# Patient Record
Sex: Female | Born: 1955 | Race: White | Hispanic: No | Marital: Married | Smoking: Never smoker
Health system: Southern US, Community
[De-identification: ages and names within clinical notes are randomized; demographics above are authoritative.]

## PROBLEM LIST (undated history)

## (undated) DIAGNOSIS — M810 Age-related osteoporosis without current pathological fracture: Secondary | ICD-10-CM

## (undated) DIAGNOSIS — I493 Ventricular premature depolarization: Secondary | ICD-10-CM

## (undated) DIAGNOSIS — Z23 Encounter for immunization: Secondary | ICD-10-CM

## (undated) DIAGNOSIS — N390 Urinary tract infection, site not specified: Secondary | ICD-10-CM

## (undated) HISTORY — PX: BREAST BIOPSY: SHX20

## (undated) HISTORY — DX: Ventricular premature depolarization: I49.3

## (undated) HISTORY — PX: BREAST SURGERY: SHX581

## (undated) HISTORY — PX: WISDOM TOOTH EXTRACTION: SHX21

## (undated) HISTORY — DX: Urinary tract infection, site not specified: N39.0

## (undated) HISTORY — DX: Age-related osteoporosis without current pathological fracture: M81.0

## (undated) HISTORY — DX: Encounter for immunization: Z23

## (undated) HISTORY — PX: APPENDECTOMY: SHX54

## (undated) HISTORY — PX: THUMB ARTHROSCOPY: SHX2509

## (undated) HISTORY — PX: VARICOSE VEIN SURGERY: SHX832

---

## 2011-09-27 DIAGNOSIS — H401190 Primary open-angle glaucoma, unspecified eye, stage unspecified: Secondary | ICD-10-CM | POA: Insufficient documentation

## 2011-09-27 DIAGNOSIS — H47329 Drusen of optic disc, unspecified eye: Secondary | ICD-10-CM | POA: Insufficient documentation

## 2011-09-27 DIAGNOSIS — H53009 Unspecified amblyopia, unspecified eye: Secondary | ICD-10-CM | POA: Insufficient documentation

## 2011-09-27 DIAGNOSIS — H40059 Ocular hypertension, unspecified eye: Secondary | ICD-10-CM | POA: Insufficient documentation

## 2012-10-16 DIAGNOSIS — H40009 Preglaucoma, unspecified, unspecified eye: Secondary | ICD-10-CM | POA: Insufficient documentation

## 2015-10-27 LAB — HM PAP SMEAR: HM Pap smear: NEGATIVE

## 2015-11-18 LAB — HM COLONOSCOPY

## 2016-08-16 LAB — CBC AND DIFFERENTIAL
HEMATOCRIT: 42 (ref 36–46)
HEMOGLOBIN: 13.6 (ref 12.0–16.0)
PLATELETS: 238 (ref 150–399)
WBC: 6.8

## 2016-08-16 LAB — BASIC METABOLIC PANEL
BUN: 14 (ref 4–21)
Creatinine: 0.6 (ref 0.5–1.1)
Glucose: 98
Potassium: 4.8 (ref 3.4–5.3)
Sodium: 141 (ref 137–147)

## 2016-08-16 LAB — LIPID PANEL
CHOLESTEROL: 225 — AB (ref 0–200)
HDL: 69 (ref 35–70)
LDL Cholesterol: 140
TRIGLYCERIDES: 82 (ref 40–160)

## 2016-08-16 LAB — TSH: TSH: 0.8 (ref 0.41–5.90)

## 2017-03-16 ENCOUNTER — Encounter: Payer: Self-pay | Admitting: Family Medicine

## 2017-03-24 LAB — HM MAMMOGRAPHY

## 2017-07-27 ENCOUNTER — Encounter: Payer: Self-pay | Admitting: Family Medicine

## 2017-07-27 ENCOUNTER — Ambulatory Visit (INDEPENDENT_AMBULATORY_CARE_PROVIDER_SITE_OTHER): Payer: 59 | Admitting: Family Medicine

## 2017-07-27 VITALS — BP 122/84 | HR 64 | Temp 98.2°F | Ht 62.5 in | Wt 132.0 lb

## 2017-07-27 DIAGNOSIS — S93409A Sprain of unspecified ligament of unspecified ankle, initial encounter: Secondary | ICD-10-CM | POA: Insufficient documentation

## 2017-07-27 DIAGNOSIS — Z13220 Encounter for screening for lipoid disorders: Secondary | ICD-10-CM

## 2017-07-27 DIAGNOSIS — M25571 Pain in right ankle and joints of right foot: Secondary | ICD-10-CM | POA: Diagnosis not present

## 2017-07-27 DIAGNOSIS — Z0000 Encounter for general adult medical examination without abnormal findings: Secondary | ICD-10-CM | POA: Insufficient documentation

## 2017-07-27 LAB — COMPREHENSIVE METABOLIC PANEL
ALBUMIN: 4.1 g/dL (ref 3.5–5.2)
ALK PHOS: 74 U/L (ref 39–117)
ALT: 13 U/L (ref 0–35)
AST: 18 U/L (ref 0–37)
BUN: 9 mg/dL (ref 6–23)
CALCIUM: 9.4 mg/dL (ref 8.4–10.5)
CO2: 30 mEq/L (ref 19–32)
Chloride: 103 mEq/L (ref 96–112)
Creatinine, Ser: 0.61 mg/dL (ref 0.40–1.20)
GFR: 106.05 mL/min (ref >60.00)
Glucose, Bld: 101 mg/dL — ABNORMAL HIGH (ref 70–99)
POTASSIUM: 4.4 meq/L (ref 3.5–5.1)
Sodium: 140 mEq/L (ref 135–145)
TOTAL PROTEIN: 7 g/dL (ref 6.0–8.3)
Total Bilirubin: 0.4 mg/dL (ref 0.2–1.2)

## 2017-07-27 LAB — CBC
HCT: 39.7 % (ref 36.0–46.0)
HEMOGLOBIN: 13.5 g/dL (ref 12.0–15.0)
MCHC: 34.1 g/dL (ref 30.0–36.0)
MCV: 88.8 fl (ref 78.0–100.0)
Platelets: 223 10*3/uL (ref 150.0–400.0)
RBC: 4.47 Mil/uL (ref 3.87–5.11)
RDW: 12.5 % (ref 11.5–15.5)
WBC: 5.3 10*3/uL (ref 4.0–10.5)

## 2017-07-27 LAB — LIPID PANEL
CHOL/HDL RATIO: 3
Cholesterol: 221 mg/dL — ABNORMAL HIGH (ref 0–200)
HDL: 74.5 mg/dL (ref >39.00)
LDL CALC: 124 mg/dL — AB (ref 0–99)
NonHDL: 146.57
Triglycerides: 114 mg/dL (ref 0.0–149.0)
VLDL: 22.8 mg/dL (ref 0.0–40.0)

## 2017-07-27 LAB — TSH: TSH: 1.3 u[IU]/mL (ref 0.35–4.50)

## 2017-07-27 NOTE — Assessment & Plan Note (Signed)
Discussed that sprains can take a while to heal. She requests to see sports medicine for further evaluation as it is limiting her activity with hiking.

## 2017-07-27 NOTE — Assessment & Plan Note (Signed)
Well adult Orders Placed This Encounter  Procedures  . Comp Met (CMET)  . CBC  . TSH  . Lipid Profile  . Ambulatory referral to Sports Medicine    Referral Priority:   Routine    Referral Type:   Consultation    Number of Visits Requested:   1  Immunizations:  Reports up to date Screenings:  Lipid screening ordered.  Reports up to date on other screenings.  Anticipatory guidance/Risk factor reduction:  Advised to keep up healthy lifestyle habits.  Additional recommendations per AVS.

## 2017-07-27 NOTE — Progress Notes (Signed)
Kimberly Cross - 62 y.o. female MRN [id number]  Date of birth: 09-08-1955  Subjective Chief Complaint  Patient presents with  . Establish Care    CPE with labs , fasting    HPI Kimberly Cross is a 62 y.o. female here today to establish care.  She has been in fairly good health.  Reports rolling her ankle stepping off a sidewalk about 9-10 weeks ago.  Had swelling and bruising throughout entire ankle initially.  Improved but still with some swelling over lateral malleolus.  No pain with walking but very difficult to walk going uphill.  Denies numbness and tingling.    In regards to health maintenance she reports having pap and mammogram prior to moving from [place] this year.  She had a colonoscopy last year.   Review of Systems  Constitutional: Negative for chills, fever, malaise/fatigue and weight loss.  HENT: Negative for congestion, ear pain and sore throat.   Eyes: Negative for blurred vision, double vision and pain.  Respiratory: Negative for cough and shortness of breath.   Cardiovascular: Negative for chest pain and palpitations.  Gastrointestinal: Negative for abdominal pain, blood in stool, constipation, heartburn and nausea.  Genitourinary: Negative for dysuria and urgency.  Musculoskeletal: Positive for joint pain (Right ankle). Negative for myalgias.  Neurological: Negative for dizziness and headaches.  Endo/Heme/Allergies: Does not bruise/bleed easily.  Psychiatric/Behavioral: Negative for depression. The patient is not nervous/anxious and does not have insomnia.      No Known Allergies  Past Medical History:  Diagnosis Date  . Immunization, BCG   . UTI (urinary tract infection)     Past Surgical History:  Procedure Laterality Date  . APPENDECTOMY    . BREAST SURGERY    . THUMB ARTHROSCOPY Bilateral     Social History   Socioeconomic History  . Marital status: Married    Spouse name: Not on file  . Number of children: Not on file  . Years of education: Not on file  .  Highest education level: Not on file  Occupational History  . Not on file  Social Needs  . Financial resource strain: Not on file  . Food insecurity:    Worry: Not on file    Inability: Not on file  . Transportation needs:    Medical: Not on file    Non-medical: Not on file  Tobacco Use  . Smoking status: Never Smoker  . Smokeless tobacco: Never Used  Substance and Sexual Activity  . Alcohol use: Never    Frequency: Never  . Drug use: Never  . Sexual activity: Yes  Lifestyle  . Physical activity:    Days per week: Not on file    Minutes per session: Not on file  . Stress: Not on file  Relationships  . Social connections:    Talks on phone: Not on file    Gets together: Not on file    Attends religious service: Not on file    Active member of club or organization: Not on file    Attends meetings of clubs or organizations: Not on file    Relationship status: Not on file  Other Topics Concern  . Not on file  Social History Narrative  . Not on file    Family History  Problem Relation Age of Onset  . Hyperlipidemia Mother   . Diabetes Father   . Heart disease Father   . Diabetes Sister   . Early death Maternal Grandmother   . Heart disease Maternal Grandfather   .  Stroke Paternal Grandmother   . Cancer Paternal Grandfather   . Diabetes Brother   . Cancer Brother     Health Maintenance  Topic Date Due  . Hepatitis C Screening  10/05/1955  . HIV Screening  10/09/1971  . TETANUS/TDAP  10/09/1975  . INFLUENZA VACCINE  08/03/2017  . MAMMOGRAM  01/10/2019  . PAP SMEAR  01/10/2020  . COLONOSCOPY  01/10/2026    ----------------------------------------------------------------------------------------------------------------------------------------------------------------------------------------------------------------- Physical Exam BP 122/84 (BP Location: Left Arm, Patient Position: Sitting, Cuff Size: Normal)   Pulse 64   Temp 98.2 F (36.8 C) (Oral)   Ht 5'  2.5" (1.588 m)   Wt 132 lb (59.9 kg)   SpO2 100%   BMI 23.76 kg/m   Physical Exam  Constitutional: She is oriented to person, place, and time. She appears well-nourished. No distress.  HENT:  Head: Normocephalic and atraumatic.  Right Ear: External ear normal.  Left Ear: External ear normal.  Mouth/Throat: Oropharynx is clear and moist.  Eyes: No scleral icterus.  Neck: Neck supple. No thyromegaly present.  Cardiovascular: Normal rate, regular rhythm, normal heart sounds and intact distal pulses.  Pulmonary/Chest: Effort normal and breath sounds normal.  Musculoskeletal:  Right ankle with small effusion overlying lateral malleolus.  No ttp along bony structures.  ROM is fairly good.  Some difficulty with dorsiflexion.  Pain with drawer testing posterior to malleolus.     Lymphadenopathy:    She has no cervical adenopathy.  Neurological: She is alert and oriented to person, place, and time. No cranial nerve deficit.  Skin: Skin is warm and dry. No rash noted.  Psychiatric: She has a normal mood and affect. Her behavior is normal.    ------------------------------------------------------------------------------------------------------------------------------------------------------------------------------------------------------------------- Assessment and Plan  Acute right ankle pain Discussed that sprains can take a while to heal. She requests to see sports medicine for further evaluation as it is limiting her activity with hiking.    Well adult exam Well adult Orders Placed This Encounter  Procedures  . Comp Met (CMET)  . CBC  . TSH  . Lipid Profile  . Ambulatory referral to Sports Medicine    Referral Priority:   Routine    Referral Type:   Consultation    Number of Visits Requested:   1  Immunizations:  Reports up to date Screenings:  Lipid screening ordered.  Reports up to date on other screenings.  Anticipatory guidance/Risk factor reduction:  Advised to keep up  healthy lifestyle habits.  Additional recommendations per AVS.      

## 2017-07-27 NOTE — Patient Instructions (Signed)

## 2017-07-31 ENCOUNTER — Ambulatory Visit (INDEPENDENT_AMBULATORY_CARE_PROVIDER_SITE_OTHER): Payer: 59

## 2017-07-31 ENCOUNTER — Ambulatory Visit: Payer: 59 | Admitting: Family Medicine

## 2017-07-31 ENCOUNTER — Encounter: Payer: Self-pay | Admitting: Family Medicine

## 2017-07-31 VITALS — BP 122/62 | HR 72 | Ht 62.5 in | Wt 132.0 lb

## 2017-07-31 DIAGNOSIS — S93492A Sprain of other ligament of left ankle, initial encounter: Secondary | ICD-10-CM | POA: Diagnosis not present

## 2017-07-31 DIAGNOSIS — M25571 Pain in right ankle and joints of right foot: Secondary | ICD-10-CM

## 2017-07-31 NOTE — Patient Instructions (Signed)
Nice to meet you  Please try the exercises on a regular basis  Please try ice and compression  Please see me back in 3 weeks

## 2017-07-31 NOTE — Progress Notes (Signed)
Kimberly Cross - 62 y.o. female MRN [id number]  Date of birth: 1955/03/18  SUBJECTIVE:  Including CC & ROS.  Chief Complaint  Patient presents with  . 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 She reports she fell off her sidewalk and twisted her ankle 10 weeks ago. Had swelling and bruising throughout entire ankle initially. Swelling has improved over her lateral malleolus.  Denies pain walking. She enjoys hiking but is unable to due to the pain walking uphill. Severe to mild when walking uphill. She has been applying ice and elevating her ankle. Pain is occurring over the distal fibula and the lateral ankle joint. No pain with walking on flat. Has turned her ankle in the past but hasn't lasted this long before.      Review of Systems  Constitutional: Negative for fever.  HENT: Negative for congestion.   Respiratory: Negative for cough.   Cardiovascular: Negative for chest pain.  Gastrointestinal: Negative for abdominal pain.  Musculoskeletal: Positive for gait problem.  Skin: Negative for color change.  Neurological: Negative for weakness.  Hematological: Negative for adenopathy.  Psychiatric/Behavioral: Negative for agitation.    HISTORY: Past Medical, Surgical, Social, and Family History Reviewed & Updated per EMR.   Pertinent Historical Findings include:  Past Medical History:  Diagnosis Date  . Immunization, BCG   . UTI (urinary tract infection)     Past Surgical History:  Procedure Laterality Date  . APPENDECTOMY    . BREAST SURGERY    . THUMB ARTHROSCOPY Bilateral     No Known Allergies  Family History  Problem Relation Age of Onset  . Hyperlipidemia Mother   . Diabetes Father   . Heart disease Father   . Diabetes Sister   . Early death Maternal Grandmother   . Heart disease Maternal Grandfather   . Stroke Paternal Grandmother   . Cancer Paternal Grandfather   . Diabetes Brother   . Cancer Brother      Social  History   Socioeconomic History  . Marital status: Married    Spouse name: Not on file  . Number of children: Not on file  . Years of education: Not on file  . Highest education level: Not on file  Occupational History  . Not on file  Social Needs  . Financial resource strain: Not on file  . Food insecurity:    Worry: Not on file    Inability: Not on file  . Transportation needs:    Medical: Not on file    Non-medical: Not on file  Tobacco Use  . Smoking status: Never Smoker  . Smokeless tobacco: Never Used  Substance and Sexual Activity  . Alcohol use: Never    Frequency: Never  . Drug use: Never  . Sexual activity: Yes  Lifestyle  . Physical activity:    Days per week: Not on file    Minutes per session: Not on file  . Stress: Not on file  Relationships  . Social connections:    Talks on phone: Not on file    Gets together: Not on file    Attends religious service: Not on file    Active member of club or organization: Not on file    Attends meetings of clubs or organizations: Not on file    Relationship status: Not on file  . Intimate partner violence:    Fear of current or ex partner: Not on file  Emotionally abused: Not on file    Physically abused: Not on file    Forced sexual activity: Not on file  Other Topics Concern  . Not on file  Social History Narrative  . Not on file     PHYSICAL EXAM:  VS: BP 122/62 (BP Location: Right Arm, Patient Position: Sitting, Cuff Size: Normal)   Pulse 72   Ht 5' 2.5" (1.588 m)   Wt 132 lb (59.9 kg)   SpO2 97%   BMI 23.76 kg/m  Physical Exam Gen: NAD, alert, cooperative with exam, well-appearing ENT: normal lips, normal nasal mucosa,  Eye: normal EOM, normal conjunctiva and lids CV:  no edema, +2 pedal pulses   Resp: no accessory muscle use, non-labored,  Skin: no rashes, no areas of induration  Neuro: normal tone, normal sensation to touch Psych:  normal insight, alert and oriented MSK:  Right Ankle &  Foot: No ecchymosis, erythema, ulcers, calluses, blister Swelling distal to the distal fibula  No pain at base of 5th MT; No tenderness over cuboid; No tenderness over N spot or navicular prominence No tenderness on medial malleolus No sign of peroneal tendon subluxations or tenderness to palpation Full in plantarflexion, dorsiflexion, inversion, and eversion of the foot; flexion and extension of the toes Strength: 5/5 in all directions. Sensation: intact Endpoints with anterior drawer  Neurovascularly intact   Limited ultrasound: right ankle:  No fracture of the distal fibula  Normal appearing peroneal tendons with no subluxation  Soft tissue present in the lateral ankle  Summary: findings consistent with ongoing ankle sprain   Ultrasound and interpretation by Clearance Coots, MD       ASSESSMENT & PLAN:   Ankle sprain Likely having persistent symptoms of sprain. Independent review of the xray doesn't demonstrate a fracture  - xray  - counseled on HEP  - placed in an ASO brace  - f/u in 3 weeks. Consider PT at that time and weaning brace

## 2017-07-31 NOTE — Assessment & Plan Note (Signed)
Likely having persistent symptoms of sprain. Independent review of the xray doesn't demonstrate a fracture  - xray  - counseled on HEP  - placed in an ASO brace  - f/u in 3 weeks. Consider PT at that time and weaning brace

## 2017-08-21 ENCOUNTER — Ambulatory Visit: Payer: 59 | Admitting: Family Medicine

## 2017-09-11 ENCOUNTER — Ambulatory Visit: Payer: Self-pay | Admitting: Primary Care

## 2018-01-19 ENCOUNTER — Telehealth: Payer: Self-pay | Admitting: Family Medicine

## 2018-01-19 NOTE — Telephone Encounter (Signed)
Received form-will notify patient once completed

## 2018-01-19 NOTE — Telephone Encounter (Signed)
Patient dropped off form about being a participate of a study with [facility] and needs a letter saying that she is able to be in the study. Form will be in [place] folder up at the front. Please call patient at  [phone] once form and letter in completed.

## 2018-01-23 NOTE — Telephone Encounter (Signed)
Form completed and left a message letting patient know form is up front ready to be picked up

## 2018-01-24 ENCOUNTER — Encounter: Payer: Self-pay | Admitting: Family Medicine

## 2018-06-12 ENCOUNTER — Telehealth: Payer: Self-pay | Admitting: Family Medicine

## 2018-06-12 ENCOUNTER — Other Ambulatory Visit: Payer: Self-pay | Admitting: Family Medicine

## 2018-06-12 DIAGNOSIS — Z13220 Encounter for screening for lipoid disorders: Secondary | ICD-10-CM

## 2018-06-12 DIAGNOSIS — Z0000 Encounter for general adult medical examination without abnormal findings: Secondary | ICD-10-CM

## 2018-06-12 NOTE — Telephone Encounter (Signed)
Orders entered, please schedule for lab visit.

## 2018-06-12 NOTE — Telephone Encounter (Signed)
Lab visit scheduled.

## 2018-06-12 NOTE — Telephone Encounter (Signed)
Pt called to schedule physical for this year. Patient is wanting to know if she could get her labs drawn before her appt. I don't see any labs orders in her chart. She states that the same labs that was drawn last year would be fine. Please advise. I will call patient back to schedule lab appt

## 2018-07-31 ENCOUNTER — Other Ambulatory Visit (INDEPENDENT_AMBULATORY_CARE_PROVIDER_SITE_OTHER): Payer: 59

## 2018-07-31 DIAGNOSIS — Z0000 Encounter for general adult medical examination without abnormal findings: Secondary | ICD-10-CM | POA: Diagnosis not present

## 2018-07-31 DIAGNOSIS — Z13220 Encounter for screening for lipoid disorders: Secondary | ICD-10-CM

## 2018-07-31 LAB — CBC WITH DIFFERENTIAL/PLATELET
Basophils Absolute: 0 10*3/uL (ref 0.0–0.1)
Basophils Relative: 0.6 % (ref 0.0–3.0)
Eosinophils Absolute: 0 10*3/uL (ref 0.0–0.7)
Eosinophils Relative: 0.8 % (ref 0.0–5.0)
HCT: 40.1 % (ref 36.0–46.0)
Hemoglobin: 13.4 g/dL (ref 12.0–15.0)
Lymphocytes Relative: 36.5 % (ref 12.0–46.0)
Lymphs Abs: 1.7 10*3/uL (ref 0.7–4.0)
MCHC: 33.4 g/dL (ref 30.0–36.0)
MCV: 91.2 fl (ref 78.0–100.0)
Monocytes Absolute: 0.3 10*3/uL (ref 0.1–1.0)
Monocytes Relative: 5.6 % (ref 3.0–12.0)
Neutro Abs: 2.7 10*3/uL (ref 1.4–7.7)
Neutrophils Relative %: 56.5 % (ref 43.0–77.0)
Platelets: 232 10*3/uL (ref 150.0–400.0)
RBC: 4.4 Mil/uL (ref 3.87–5.11)
RDW: 12.7 % (ref 11.5–15.5)
WBC: 4.7 10*3/uL (ref 4.0–10.5)

## 2018-07-31 LAB — COMPREHENSIVE METABOLIC PANEL
ALT: 15 U/L (ref 0–35)
AST: 22 U/L (ref 0–37)
Albumin: 4.2 g/dL (ref 3.5–5.2)
Alkaline Phosphatase: 84 U/L (ref 39–117)
BUN: 11 mg/dL (ref 6–23)
CO2: 30 mEq/L (ref 19–32)
Calcium: 9.4 mg/dL (ref 8.4–10.5)
Chloride: 103 mEq/L (ref 96–112)
Creatinine, Ser: 0.65 mg/dL (ref 0.40–1.20)
GFR: 92.42 mL/min (ref >60.00)
Glucose, Bld: 95 mg/dL (ref 70–99)
Potassium: 4.7 mEq/L (ref 3.5–5.1)
Sodium: 140 mEq/L (ref 135–145)
Total Bilirubin: 0.4 mg/dL (ref 0.2–1.2)
Total Protein: 6.8 g/dL (ref 6.0–8.3)

## 2018-07-31 LAB — LIPID PANEL
Cholesterol: 222 mg/dL — ABNORMAL HIGH (ref 0–200)
HDL: 75.3 mg/dL (ref >39.00)
LDL Cholesterol: 131 mg/dL — ABNORMAL HIGH (ref 0–99)
NonHDL: 146.29
Total CHOL/HDL Ratio: 3
Triglycerides: 75 mg/dL (ref 0.0–149.0)
VLDL: 15 mg/dL (ref 0.0–40.0)

## 2018-07-31 LAB — TSH: TSH: 0.96 u[IU]/mL (ref 0.35–4.50)

## 2018-08-06 ENCOUNTER — Encounter: Payer: Self-pay | Admitting: Family Medicine

## 2018-08-06 ENCOUNTER — Ambulatory Visit (INDEPENDENT_AMBULATORY_CARE_PROVIDER_SITE_OTHER): Payer: 59 | Admitting: Family Medicine

## 2018-08-06 VITALS — BP 136/80 | HR 65 | Temp 97.5°F | Ht 62.5 in | Wt 131.0 lb

## 2018-08-06 DIAGNOSIS — Z124 Encounter for screening for malignant neoplasm of cervix: Secondary | ICD-10-CM

## 2018-08-06 DIAGNOSIS — Z0000 Encounter for general adult medical examination without abnormal findings: Secondary | ICD-10-CM

## 2018-08-06 DIAGNOSIS — Z23 Encounter for immunization: Secondary | ICD-10-CM

## 2018-08-06 NOTE — Assessment & Plan Note (Signed)
Well adult Labs reviewed today, continue healthy habits.  Screenings:  UTD.  Needs to set up with new GYN since moving for future pap/mammo Immunizations: Tdap given Anticipatory guidance/risk factor reduction:  Recommendations per AVS.

## 2018-08-06 NOTE — Patient Instructions (Signed)
Preventive Care 57-62 Years Old, Female Preventive care refers to visits with your health care provider and lifestyle choices that can promote health and wellness. This includes:  A yearly physical exam. This may also be called an annual well check.  Regular dental visits and eye exams.  Immunizations.  Screening for certain conditions.  Healthy lifestyle choices, such as eating a healthy diet, getting regular exercise, not using drugs or products that contain nicotine and tobacco, and limiting alcohol use. What can I expect for my preventive care visit? Physical exam Your health care provider will check your:  Height and weight. This may be used to calculate body mass index (BMI), which tells if you are at a healthy weight.  Heart rate and blood pressure.  Skin for abnormal spots. Counseling Your health care provider may ask you questions about your:  Alcohol, tobacco, and drug use.  Emotional well-being.  Home and relationship well-being.  Sexual activity.  Eating habits.  Work and work Statistician.  Method of birth control.  Menstrual cycle.  Pregnancy history. What immunizations do I need?  Influenza (flu) vaccine  This is recommended every year. Tetanus, diphtheria, and pertussis (Tdap) vaccine  You may need a Td booster every 10 years. Varicella (chickenpox) vaccine  You may need this if you have not been vaccinated. Zoster (shingles) vaccine  You may need this after age 57. Measles, mumps, and rubella (MMR) vaccine  You may need at least one dose of MMR if you were born in 1957 or later. You may also need a second dose. Pneumococcal conjugate (PCV13) vaccine  You may need this if you have certain conditions and were not previously vaccinated. Pneumococcal polysaccharide (PPSV23) vaccine  You may need one or two doses if you smoke cigarettes or if you have certain conditions. Meningococcal conjugate (MenACWY) vaccine  You may need this if you  have certain conditions. Hepatitis A vaccine  You may need this if you have certain conditions or if you travel or work in places where you may be exposed to hepatitis A. Hepatitis B vaccine  You may need this if you have certain conditions or if you travel or work in places where you may be exposed to hepatitis B. Haemophilus influenzae type b (Hib) vaccine  You may need this if you have certain conditions. Human papillomavirus (HPV) vaccine  If recommended by your health care provider, you may need three doses over 6 months. You may receive vaccines as individual doses or as more than one vaccine together in one shot (combination vaccines). Talk with your health care provider about the risks and benefits of combination vaccines. What tests do I need? Blood tests  Lipid and cholesterol levels. These may be checked every 5 years, or more frequently if you are over 61 years old.  Hepatitis C test.  Hepatitis B test. Screening  Lung cancer screening. You may have this screening every year starting at age 45 if you have a 30-pack-year history of smoking and currently smoke or have quit within the past 15 years.  Colorectal cancer screening. All adults should have this screening starting at age 86 and continuing until age 23. Your health care provider may recommend screening at age 86 if you are at increased risk. You will have tests every 1-10 years, depending on your results and the type of screening test.  Diabetes screening. This is done by checking your blood sugar (glucose) after you have not eaten for a while (fasting). You may have this  done every 1-3 years.  Mammogram. This may be done every 1-2 years. Talk with your health care provider about when you should start having regular mammograms. This may depend on whether you have a family history of breast cancer.  BRCA-related cancer screening. This may be done if you have a family history of breast, ovarian, tubal, or peritoneal  cancers.  Pelvic exam and Pap test. This may be done every 3 years starting at age 21. Starting at age 30, this may be done every 5 years if you have a Pap test in combination with an HPV test. Other tests  Sexually transmitted disease (STD) testing.  Bone density scan. This is done to screen for osteoporosis. You may have this scan if you are at high risk for osteoporosis. Follow these instructions at home: Eating and drinking  Eat a diet that includes fresh fruits and vegetables, whole grains, lean protein, and low-fat dairy.  Take vitamin and mineral supplements as recommended by your health care provider.  Do not drink alcohol if: ? Your health care provider tells you not to drink. ? You are pregnant, may be pregnant, or are planning to become pregnant.  If you drink alcohol: ? Limit how much you have to 0-1 drink a day. ? Be aware of how much alcohol is in your drink. In the [place] one drink equals one 12 oz bottle of beer (355 mL), one 5 oz glass of wine (148 mL), or one 1 oz glass of hard liquor (44 mL). Lifestyle  Take daily care of your teeth and gums.  Stay active. Exercise for at least 30 minutes on 5 or more days each week.  Do not use any products that contain nicotine or tobacco, such as cigarettes, e-cigarettes, and chewing tobacco. If you need help quitting, ask your health care provider.  If you are sexually active, practice safe sex. Use a condom or other form of birth control (contraception) in order to prevent pregnancy and STIs (sexually transmitted infections).  If told by your health care provider, take low-dose aspirin daily starting at age 50. What's next?  Visit your health care provider once a year for a well check visit.  Ask your health care provider how often you should have your eyes and teeth checked.  Stay up to date on all vaccines. This information is not intended to replace advice given to you by your health care provider. Make sure you  discuss any questions you have with your health care provider. Document Released: 01/16/2015 Document Revised: 08/31/2017 Document Reviewed: 08/31/2017 Elsevier Patient Education  2020 [employer]  

## 2018-08-06 NOTE — Progress Notes (Signed)
Kimberly Cross - 63 y.o. female MRN [id number]  Date of birth: Dec 14, 1955  Subjective Chief Complaint  Patient presents with  . Annual Exam    CPE- labs done/ wants tdap     HPI Kimberly Cross is a 63 y.o. female here today for annual exam.  She has no concerns today.  She had labs completed last week that we reviewed today.  Her cholesterol is elevated however ASCVD risk is only 3.6% as she has no other co-morbidities and her HDL is quite high.  She exercise several days per week and follows a mostly vegetarian diet.    Review of Systems  Constitutional: Negative for chills, fever, malaise/fatigue and weight loss.  HENT: Negative for congestion, ear pain and sore throat.   Eyes: Negative for blurred vision, double vision and pain.  Respiratory: Negative for cough and shortness of breath.   Cardiovascular: Negative for chest pain and palpitations.  Gastrointestinal: Negative for abdominal pain, blood in stool, constipation, heartburn and nausea.  Genitourinary: Negative for dysuria and urgency.  Musculoskeletal: Negative for joint pain and myalgias.  Neurological: Negative for dizziness and headaches.  Endo/Heme/Allergies: Does not bruise/bleed easily.  Psychiatric/Behavioral: Negative for depression. The patient is not nervous/anxious and does not have insomnia.     No Known Allergies  Past Medical History:  Diagnosis Date  . Immunization, BCG   . UTI (urinary tract infection)     Past Surgical History:  Procedure Laterality Date  . APPENDECTOMY    . BREAST SURGERY    . THUMB ARTHROSCOPY Bilateral     Social History   Socioeconomic History  . Marital status: Married    Spouse name: Not on file  . Number of children: Not on file  . Years of education: Not on file  . Highest education level: Not on file  Occupational History  . Not on file  Social Needs  . Financial resource strain: Not on file  . Food insecurity    Worry: Not on file    Inability: Not on file  .  Transportation needs    Medical: Not on file    Non-medical: Not on file  Tobacco Use  . Smoking status: Never Smoker  . Smokeless tobacco: Never Used  Substance and Sexual Activity  . Alcohol use: Never    Frequency: Never  . Drug use: Never  . Sexual activity: Yes  Lifestyle  . Physical activity    Days per week: Not on file    Minutes per session: Not on file  . Stress: Not on file  Relationships  . Social Herbalist on phone: Not on file    Gets together: Not on file    Attends religious service: Not on file    Active member of club or organization: Not on file    Attends meetings of clubs or organizations: Not on file    Relationship status: Not on file  Other Topics Concern  . Not on file  Social History Narrative  . Not on file    Family History  Problem Relation Age of Onset  . Hyperlipidemia Mother   . Diabetes Father   . Heart disease Father   . Diabetes Sister   . Early death Maternal Grandmother   . Heart disease Maternal Grandfather   . Stroke Paternal Grandmother   . Cancer Paternal Grandfather   . Diabetes Brother   . Cancer Brother     Health Maintenance  Topic Date Due  .  Hepatitis C Screening  [birth year]  . HIV Screening  10/09/1971  . INFLUENZA VACCINE  08/04/2018  . MAMMOGRAM  01/10/2019  . PAP SMEAR-Modifier  01/10/2020  . COLONOSCOPY  01/10/2026  . TETANUS/TDAP  08/05/2028    ----------------------------------------------------------------------------------------------------------------------------------------------------------------------------------------------------------------- Physical Exam BP 136/80   Pulse 65   Temp (!) 97.5 F (36.4 C) (Oral)   Ht 5' 2.5" (1.588 m)   Wt 131 lb (59.4 kg)   SpO2 98%   BMI 23.58 kg/m   Physical Exam Constitutional:      General: She is not in acute distress. HENT:     Head: Normocephalic and atraumatic.     Nose: Nose normal.  Eyes:     General: No scleral icterus.     Conjunctiva/sclera: Conjunctivae normal.  Neck:     Musculoskeletal: Normal range of motion and neck supple.     Thyroid: No thyromegaly.  Cardiovascular:     Rate and Rhythm: Normal rate and regular rhythm.     Heart sounds: Normal heart sounds.  Pulmonary:     Effort: Pulmonary effort is normal.     Breath sounds: Normal breath sounds.  Abdominal:     General: Bowel sounds are normal. There is no distension.     Palpations: Abdomen is soft.     Tenderness: There is no abdominal tenderness. There is no guarding.  Musculoskeletal: Normal range of motion.  Lymphadenopathy:     Cervical: No cervical adenopathy.  Skin:    General: Skin is warm and dry.     Findings: No rash.  Neurological:     Mental Status: She is alert and oriented to person, place, and time.     Cranial Nerves: No cranial nerve deficit.     Coordination: Coordination normal.  Psychiatric:        Behavior: Behavior normal.     ------------------------------------------------------------------------------------------------------------------------------------------------------------------------------------------------------------------- Assessment and Plan  Well adult exam Well adult Labs reviewed today, continue healthy habits.  Screenings:  UTD.  Needs to set up with new GYN since moving for future pap/mammo Immunizations: Tdap given Anticipatory guidance/risk factor reduction:  Recommendations per AVS.

## 2018-08-14 ENCOUNTER — Telehealth: Payer: Self-pay | Admitting: Radiology

## 2018-08-14 NOTE — Telephone Encounter (Signed)
Left message to call [email or web address] to schedule appointment from referral for cervical cancer screening, phone number provided

## 2018-09-19 ENCOUNTER — Encounter: Payer: 59 | Admitting: Advanced Practice Midwife

## 2018-10-03 ENCOUNTER — Encounter: Payer: 59 | Admitting: Advanced Practice Midwife

## 2019-04-29 ENCOUNTER — Ambulatory Visit: Payer: 59 | Admitting: Family Medicine

## 2019-04-29 ENCOUNTER — Other Ambulatory Visit: Payer: Self-pay

## 2019-04-29 ENCOUNTER — Encounter: Payer: Self-pay | Admitting: Family Medicine

## 2019-04-29 VITALS — BP 144/78 | HR 73 | Temp 96.9°F | Ht 62.0 in | Wt 130.0 lb

## 2019-04-29 DIAGNOSIS — L237 Allergic contact dermatitis due to plants, except food: Secondary | ICD-10-CM | POA: Diagnosis not present

## 2019-04-29 DIAGNOSIS — M810 Age-related osteoporosis without current pathological fracture: Secondary | ICD-10-CM

## 2019-04-29 MED ORDER — TRIAMCINOLONE ACETONIDE 0.1 % EX CREA: 1.0000 "application " | TOPICAL_CREAM | Freq: Two times a day (BID) | CUTANEOUS | 0 refills | Status: DC

## 2019-04-29 NOTE — Progress Notes (Signed)
Subjective:     Kimberly Cross is a 64 y.o. female presenting for Transfer of Care (from Dr Zigmund Daniel) and Rash (arms and legs-started a week ago. Redness.)     HPI  #Osteoporosis - tried treatment initially - this has been stable - diagnosed in her 35s - has been a couple years since last dexa scan  #rash - was cleaning out a brush patch 1 week ago - was pulling up weeds - thinks it was poison ivy - and then started breaking out - cortisone 10 - hot water for relief - taking a benadryl at night - has been oozing and it is drying up   Review of Systems   Social History   Tobacco Use  Smoking Status Never Smoker  Smokeless Tobacco Never Used        Objective:    BP Readings from Last 3 Encounters:  04/29/19 (!) 144/78  08/06/18 136/80  07/31/17 122/62   Wt Readings from Last 3 Encounters:  04/29/19 130 lb (59 kg)  08/06/18 131 lb (59.4 kg)  07/31/17 132 lb (59.9 kg)    BP (!) 144/78   Pulse 73   Temp (!) 96.9 F (36.1 C)   Ht 5\' 2"  (1.575 m)   Wt 130 lb (59 kg)   SpO2 97%   BMI 23.78 kg/m    Physical Exam Constitutional:      General: She is not in acute distress.    Appearance: She is well-developed. She is not diaphoretic.  HENT:     Right Ear: External ear normal.     Left Ear: External ear normal.     Nose: Nose normal.  Eyes:     Conjunctiva/sclera: Conjunctivae normal.  Cardiovascular:     Rate and Rhythm: Normal rate.  Pulmonary:     Effort: Pulmonary effort is normal.  Musculoskeletal:     Cervical back: Neck supple.  Skin:    General: Skin is warm and dry.     Capillary Refill: Capillary refill takes less than 2 seconds.     Comments: Several erythematous plaques and papules with some oozing on the upper and lower extremities.   Neurological:     Mental Status: She is alert. Mental status is at baseline.  Psychiatric:        Mood and Affect: Mood normal.        Behavior: Behavior normal.       The 10-year ASCVD risk  score Mikey Bussing DC Jr., et al., 2013) is: 4.6%   Values used to calculate the score:     Age: 46 years     Sex: Female     Is Non-Hispanic African American: No     Diabetic: No     Tobacco smoker: No     Systolic Blood Pressure: 123456 mmHg     Is BP treated: No     HDL Cholesterol: 75.3 mg/dL     Total Cholesterol: 222 mg/dL     Assessment & Plan:   Problem List Items Addressed This Visit      Musculoskeletal and Integument   Osteoporosis    Long hx has been treating with exercise and vitamins. Planning to establish with GYN and get Dexa/mammogram. Will get records      Relevant Orders   DG Bone Density   Poison ivy dermatitis - Primary    Encouraged cortisone 10 twice daily and triamcinolone if no improvement. Return if not resolving or worsening      Relevant  Medications   triamcinolone cream (KENALOG) 0.1 %       Return for wellness.  Lesleigh Noe, MD

## 2019-04-29 NOTE — Patient Instructions (Addendum)
Poison Ivy - try cortisone 10 twice daily  - if no improvement, then pick up high dose steroid - return if not resolving

## 2019-04-29 NOTE — Assessment & Plan Note (Signed)
Long hx has been treating with exercise and vitamins. Planning to establish with GYN and get Dexa/mammogram. Will get records

## 2019-04-29 NOTE — Assessment & Plan Note (Signed)
Encouraged cortisone 10 twice daily and triamcinolone if no improvement. Return if not resolving or worsening

## 2019-05-06 ENCOUNTER — Telehealth: Payer: Self-pay | Admitting: Family Medicine

## 2019-05-06 NOTE — Telephone Encounter (Signed)
Recv'd records from [facility]. Raelyn Mora  forwarded 13 pages to Dr. Waunita Schooner 5/3/21fbg

## 2019-06-25 ENCOUNTER — Other Ambulatory Visit: Payer: Self-pay | Admitting: Family Medicine

## 2019-06-25 DIAGNOSIS — Z1231 Encounter for screening mammogram for malignant neoplasm of breast: Secondary | ICD-10-CM

## 2019-08-14 ENCOUNTER — Other Ambulatory Visit: Payer: Self-pay | Admitting: Family Medicine

## 2019-08-14 NOTE — Progress Notes (Signed)
Patient is due for some screening labs that require discussion.   Would recommend that she wait and get blood work at our annual visit so we can discuss what tests are necessary.

## 2019-08-14 NOTE — Progress Notes (Signed)
LVM for patient to wait until cpx to do lab work, per Dr Einar Pheasant. Also, to bring her insurance form with her so all labs will be ordered. Lab appt has been canceled.

## 2019-08-26 ENCOUNTER — Other Ambulatory Visit: Payer: 59

## 2019-09-02 ENCOUNTER — Other Ambulatory Visit: Payer: Self-pay

## 2019-09-02 ENCOUNTER — Ambulatory Visit (INDEPENDENT_AMBULATORY_CARE_PROVIDER_SITE_OTHER): Payer: 59 | Admitting: Family Medicine

## 2019-09-02 ENCOUNTER — Encounter: Payer: Self-pay | Admitting: Family Medicine

## 2019-09-02 VITALS — BP 122/80 | HR 71 | Temp 97.9°F | Ht 62.76 in | Wt 131.8 lb

## 2019-09-02 DIAGNOSIS — E782 Mixed hyperlipidemia: Secondary | ICD-10-CM | POA: Diagnosis not present

## 2019-09-02 DIAGNOSIS — Z114 Encounter for screening for human immunodeficiency virus [HIV]: Secondary | ICD-10-CM | POA: Diagnosis not present

## 2019-09-02 DIAGNOSIS — Z0000 Encounter for general adult medical examination without abnormal findings: Secondary | ICD-10-CM

## 2019-09-02 DIAGNOSIS — Z23 Encounter for immunization: Secondary | ICD-10-CM

## 2019-09-02 DIAGNOSIS — Z1159 Encounter for screening for other viral diseases: Secondary | ICD-10-CM | POA: Diagnosis not present

## 2019-09-02 NOTE — Patient Instructions (Addendum)
Think about who did the last Colonoscopy and call back so we can request records   Would recommend the following for osteoporosis/bone health  1) 800 units of Vitamin D daily 2) Get 1200 mg of elemental calcium --- this is best from your diet. Try to track how much calcium you get on a typical day. You could find ways to add more (dairy products, leafy greens). Take a supplement for whatever you don't typically get so you reach 1200 mg of calcium.  3) Physical activity (ideally weight bearing) - like walking briskly 30 minutes 5 days a week.

## 2019-09-02 NOTE — Progress Notes (Signed)
Annual Exam   Chief Complaint:  Chief Complaint  Patient presents with  . Annual Exam    History of Present Illness:  [name] is a 63 y.o. No obstetric history on file. who LMP was No LMP recorded. Patient is postmenopausal., presents today for her annual examination.     Nutrition She does get adequate calcium and Vitamin D in her diet. Diet: pretty good Exercise: 3 days a week walking and yoga stretches   Safety The patient wears seatbelts: yes.     The patient feels safe at home and in their relationships: yes.   Menstrual:  Completed menopause  GYN She is single partner, contraception - post menopausal status.    Cervical Cancer Screening (21-65):   Last Pap:   2017 Results were: no abnormalities /neg HPV DNA   Breast Cancer Screening (Age 50-74):  There is no FH of breast cancer. There is no FH of ovarian cancer. BRCA screening Not Indicated.  Last Mammogram: 03/24/2017 The patient does want a mammogram this year.    Colon Cancer Screening:  Age 45-75 yo - benefits outweigh the risk. Adults 76-85 yo who have never been screened benefit.  Benefits: 134000 people in 2016 will be diagnosed and 49,000 will die - early detection helps Harms: Complications 2/2 to colonoscopy High Risk (Colonoscopy): genetic disorder (Lynch syndrome or familial adenomatous polyposis), personal hx of IBD, previous adenomatous polyp, or previous colorectal cancer, FamHx start 10 years before the age at diagnosis, increased in males and black race  Options:  FIT - looks for hemoglobin (blood in the stool) - specific and fairly sensitive - must be done annually Cologuard - looks for DNA and blood - more sensitive - therefore can have more false positives, every 3 years Colonoscopy - every 10 years if normal - sedation, bowl prep, must have someone drive you  Shared decision making and the patient had decided to do - had colonoscopy in 2018.   Social History   Tobacco Use  Smoking  Status Never Smoker  Smokeless Tobacco Never Used    Weight Wt Readings from Last 3 Encounters:  09/02/19 131 lb 12.8 oz (59.8 kg)  04/29/19 130 lb (59 kg)  08/06/18 131 lb (59.4 kg)   Patient has normal BMI  BMI Readings from Last 1 Encounters:  09/02/19 23.53 kg/m     Chronic disease screening Blood pressure monitoring:  BP Readings from Last 3 Encounters:  09/02/19 122/80  04/29/19 (!) 144/78  08/06/18 136/80    Lipid Monitoring: Indication for screening: age >45, obesity, diabetes, family hx, CV risk factors.  Lipid screening: Yes  Lab Results  Component Value Date   CHOL 222 (H) 07/31/2018   HDL 75.30 07/31/2018   LDLCALC 131 (H) 07/31/2018   TRIG 75.0 07/31/2018   CHOLHDL 3 07/31/2018     Diabetes Screening: age >40, overweight, family hx, PCOS, hx of gestational diabetes, at risk ethnicity Diabetes Screening screening: Yes  No results found for: HGBA1C   Past Medical History:  Diagnosis Date  . Immunization, BCG   . Osteoporosis   . PVC (premature ventricular contraction)   . UTI (urinary tract infection)     Past Surgical History:  Procedure Laterality Date  . APPENDECTOMY    . BREAST SURGERY Left    benign breast biopsy  . THUMB ARTHROSCOPY Bilateral   . WISDOM TOOTH EXTRACTION      Prior to Admission medications   Medication Sig Start Date End Date Taking? Authorizing   Provider  Multiple Vitamin (MULTI-VITAMIN DAILY PO) Take by mouth.   Yes [provider]  Turmeric 500 MG CAPS Take by mouth.   Yes [provider]  triamcinolone cream (KENALOG) 0.1 % Apply 1 application topically 2 (two) times daily. Patient not taking: Reported on 09/02/2019 04/29/19   Cody, Jessica R, MD    No Known Allergies  Gynecologic History: No LMP recorded. Patient is postmenopausal.  Obstetric History: No obstetric history on file.  Social History   Socioeconomic History  . Marital status: Married    Spouse name: Randy   . Number of  children: 2  . Years of education: nursing degree  . Highest education level: Not on file  Occupational History  . Not on file  Tobacco Use  . Smoking status: Never Smoker  . Smokeless tobacco: Never Used  Vaping Use  . Vaping Use: Never used  Substance and Sexual Activity  . Alcohol use: Yes    Comment: holidays  . Drug use: Never  . Sexual activity: Yes    Birth control/protection: Post-menopausal  Other Topics Concern  . 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 Difficulty of Paying Living Expenses: Not on file  Food Insecurity:   . Worried About Running Out of Food in the Last Year: Not on file  . Ran Out of Food in the Last Year: Not on file  Transportation Needs:   . Lack of Transportation (Medical): Not on file  . Lack of Transportation (Non-Medical): Not on file  Physical Activity:   . Days of Exercise per Week: Not on file  . Minutes of Exercise per Session: Not on file  Stress:   . Feeling of Stress : Not on file  Social Connections:   . Frequency of Communication with Friends and Family: Not on file  . Frequency of Social Gatherings with Friends and Family: Not on file  . Attends Religious Services: Not on file  . Active Member of Clubs or Organizations: Not on file  . Attends Club or Organization Meetings: Not on file  . Marital Status: Not on file  Intimate Partner Violence:   . Fear of Current or Ex-Partner: Not on file  .  Emotionally Abused: Not on file  . Physically Abused: Not on file  . Sexually Abused: Not on file    Family History  Problem Relation Age of Onset  . Hyperlipidemia Mother   . Alzheimer's disease Mother   . Diabetes Father   . Heart disease Father   . Diabetes Brother   . Kidney failure Maternal Grandmother        following hysterectomy  . Heart attack Maternal Grandfather 80  . Stroke Paternal Grandmother 75  . Lung cancer Paternal Grandfather   . Diabetes Brother   . Bladder Cancer Brother       Review of Systems  Constitutional: Negative for chills and fever.  HENT: Negative for congestion and sore throat.   Eyes: Negative for blurred vision and double vision.  Respiratory: Negative for shortness of breath.   Cardiovascular: Negative for chest pain.  Gastrointestinal: Negative for heartburn, nausea and vomiting.  Genitourinary: Negative.   Musculoskeletal: Negative.  Negative for myalgias.  Skin: Negative for rash.  Neurological: Negative for dizziness and headaches.  Endo/Heme/Allergies: Does not bruise/bleed easily.  Psychiatric/Behavioral: Negative for depression. The patient is not nervous/anxious.      Physical Exam BP 122/80   Pulse 71   Temp 97.9 F (36.6 C) (Temporal)   Ht 5' 2.76" (1.594 m)   Wt 131 lb 12.8 oz (59.8 kg)   SpO2 99%   BMI 23.53 kg/m    BP Readings from Last 3 Encounters:  09/02/19 122/80  04/29/19 (!) 144/78  08/06/18 136/80      Physical Exam Constitutional:      General: She is not in acute distress.    Appearance: She is well-developed. She is not diaphoretic.  HENT:     Head: Normocephalic and atraumatic.     Right Ear: Tympanic membrane and external ear normal.     Left Ear: Tympanic membrane and external ear normal.     Nose: Nose normal.  Eyes:     General: No scleral icterus.    Conjunctiva/sclera: Conjunctivae normal.  Cardiovascular:     Rate and Rhythm: Normal rate and regular rhythm.     Heart sounds: No murmur  heard.   Pulmonary:     Effort: Pulmonary effort is normal. No respiratory distress.     Breath sounds: Normal breath sounds. No wheezing.  Abdominal:     General: Bowel sounds are normal. There is no distension.     Palpations: Abdomen is soft. There is no mass.     Tenderness: There is no abdominal tenderness. There is no guarding or rebound.  Musculoskeletal:        General: Normal range of motion.     Cervical back: Neck supple.  Lymphadenopathy:     Cervical: No cervical adenopathy.  Skin:    General: Skin is warm and dry.     Capillary Refill: Capillary refill takes less than 2 seconds.  Neurological:     Mental Status: She is alert and oriented to person, place, and time.     Deep Tendon Reflexes: Reflexes normal.  Psychiatric:        Behavior: Behavior normal.     Results:  PHQ-9:    Office Visit from 09/02/2019 in [place] at [facility] Total Score 0        Assessment: 64 y.o. No obstetric history on file. female here for routine annual physical examination.  Plan: Problem List Items Addressed This Visit    None    Visit Diagnoses    Mixed hyperlipidemia    -  Primary   Relevant Orders   Comprehensive metabolic panel   Lipid panel   Need for hepatitis C screening test       Relevant Orders   Hepatitis C antibody   Encounter for screening for HIV       Relevant Orders   HIV Antibody (routine testing w rflx)   Annual physical exam       Need for shingles vaccine       Relevant Orders   Varicella-zoster vaccine IM (Shingrix) (Completed)      Screening: -- Blood  pressure screen normal -- cholesterol screening: will obtain -- Weight screening: normal -- Diabetes Screening: will obtain -- Nutrition: Encouraged healthy diet  The 10-year ASCVD risk score Mikey Bussing DC Jr., et al., 2013) is: 3.3%   Values used to calculate the score:     Age: 55 years     Sex: Female     Is Non-Hispanic African American: No     Diabetic: No      Tobacco smoker: No     Systolic Blood Pressure: 626 mmHg     Is BP treated: No     HDL Cholesterol: 75.3 mg/dL     Total Cholesterol: 222 mg/dL  -- Statin therapy for Age 14-75 with CVD risk >7.5%  Psych -- Depression screening (PHQ-9):    Office Visit from 09/02/2019 in [place] at [facility]  PHQ-9 Total Score 0       Safety -- tobacco screening: not using -- alcohol screening:  low-risk usage. -- no evidence of domestic violence or intimate partner violence.   Cancer Screening -- pap smear not collected per ASCCP guidelines -- family history of breast cancer screening: done. not at high risk. -- Mammogram - already scheduled -- Colon cancer (age 17+)-- will get records  Immunizations Immunization History  Administered Date(s) Administered  . PFIZER SARS-COV-2 Vaccination 03/14/2019, 04/11/2019  . Tdap 11/30/2010, 08/06/2018  . Zoster Recombinat (Shingrix) 09/02/2019    -- flu vaccine encouraged getting -- TDAP q10 years up to date -- Shingles (age >18) given first dose today -- Covid-19 Vaccine up to date   Encouraged healthy diet and exercise. Encouraged regular vision and dental care.    Lesleigh Noe, MD

## 2019-09-03 LAB — COMPREHENSIVE METABOLIC PANEL
ALT: 23 U/L (ref 0–35)
AST: 26 U/L (ref 0–37)
Albumin: 4.4 g/dL (ref 3.5–5.2)
Alkaline Phosphatase: 81 U/L (ref 39–117)
BUN: 13 mg/dL (ref 6–23)
CO2: 29 mEq/L (ref 19–32)
Calcium: 9.5 mg/dL (ref 8.4–10.5)
Chloride: 101 mEq/L (ref 96–112)
Creatinine, Ser: 0.64 mg/dL (ref 0.40–1.20)
GFR: 93.75 mL/min (ref >60.00)
Glucose, Bld: 90 mg/dL (ref 70–99)
Potassium: 4.4 mEq/L (ref 3.5–5.1)
Sodium: 138 mEq/L (ref 135–145)
Total Bilirubin: 0.4 mg/dL (ref 0.2–1.2)
Total Protein: 6.7 g/dL (ref 6.0–8.3)

## 2019-09-03 LAB — HEPATITIS C ANTIBODY
Hepatitis C Ab: NONREACTIVE
SIGNAL TO CUT-OFF: 0 (ref <1.00)

## 2019-09-03 LAB — LIPID PANEL
Cholesterol: 220 mg/dL — ABNORMAL HIGH (ref 0–200)
HDL: 77.3 mg/dL (ref >39.00)
LDL Cholesterol: 125 mg/dL — ABNORMAL HIGH (ref 0–99)
NonHDL: 143.11
Total CHOL/HDL Ratio: 3
Triglycerides: 93 mg/dL (ref 0.0–149.0)
VLDL: 18.6 mg/dL (ref 0.0–40.0)

## 2019-09-03 LAB — HIV ANTIBODY (ROUTINE TESTING W REFLEX): HIV 1&2 Ab, 4th Generation: NONREACTIVE

## 2019-09-11 ENCOUNTER — Telehealth: Payer: Self-pay | Admitting: Family Medicine

## 2019-09-11 NOTE — Telephone Encounter (Signed)
Pt's husband is checking on form that was dropped off last week to be completed by Dr. Einar Pheasant for his work. He would like a call back. I let him know that Dr. Einar Pheasant was out of the office the rest of the week.

## 2019-09-11 NOTE — Telephone Encounter (Signed)
Spoke to pt's husband and let him know that we will have form filled out by Monday, 9/13 and all they would have to fill in is pt's waist circumference.

## 2019-09-16 NOTE — Telephone Encounter (Signed)
Left pt a VM letting her know that her form has been filled out and placed up front.

## 2019-09-25 ENCOUNTER — Ambulatory Visit
Admission: RE | Admit: 2019-09-25 | Discharge: 2019-09-25 | Disposition: A | Discharge status: Home / Self Care | Payer: 59 | Source: Ambulatory Visit | Attending: Family Medicine | Admitting: Family Medicine

## 2019-09-25 ENCOUNTER — Other Ambulatory Visit: Payer: Self-pay

## 2019-09-25 DIAGNOSIS — Z1231 Encounter for screening mammogram for malignant neoplasm of breast: Secondary | ICD-10-CM

## 2019-09-25 DIAGNOSIS — M810 Age-related osteoporosis without current pathological fracture: Secondary | ICD-10-CM

## 2019-11-13 ENCOUNTER — Ambulatory Visit (INDEPENDENT_AMBULATORY_CARE_PROVIDER_SITE_OTHER): Payer: 59

## 2019-11-13 DIAGNOSIS — Z23 Encounter for immunization: Secondary | ICD-10-CM

## 2020-09-03 ENCOUNTER — Ambulatory Visit (INDEPENDENT_AMBULATORY_CARE_PROVIDER_SITE_OTHER): Payer: 59 | Admitting: Family Medicine

## 2020-09-03 ENCOUNTER — Other Ambulatory Visit: Payer: Self-pay

## 2020-09-03 VITALS — BP 140/78 | HR 65 | Temp 97.5°F | Ht 63.0 in | Wt 129.5 lb

## 2020-09-03 DIAGNOSIS — R0789 Other chest pain: Secondary | ICD-10-CM

## 2020-09-03 DIAGNOSIS — Z0000 Encounter for general adult medical examination without abnormal findings: Secondary | ICD-10-CM

## 2020-09-03 DIAGNOSIS — E782 Mixed hyperlipidemia: Secondary | ICD-10-CM | POA: Diagnosis not present

## 2020-09-03 DIAGNOSIS — M810 Age-related osteoporosis without current pathological fracture: Secondary | ICD-10-CM

## 2020-09-03 DIAGNOSIS — Z8249 Family history of ischemic heart disease and other diseases of the circulatory system: Secondary | ICD-10-CM | POA: Insufficient documentation

## 2020-09-03 LAB — CBC
HCT: 39.1 % (ref 36.0–46.0)
Hemoglobin: 13.2 g/dL (ref 12.0–15.0)
MCHC: 33.6 g/dL (ref 30.0–36.0)
MCV: 89.9 fl (ref 78.0–100.0)
Platelets: 223 10*3/uL (ref 150.0–400.0)
RBC: 4.35 Mil/uL (ref 3.87–5.11)
RDW: 12.7 % (ref 11.5–15.5)
WBC: 5.6 10*3/uL (ref 4.0–10.5)

## 2020-09-03 LAB — COMPREHENSIVE METABOLIC PANEL
ALT: 14 U/L (ref 0–35)
AST: 18 U/L (ref 0–37)
Albumin: 4.1 g/dL (ref 3.5–5.2)
Alkaline Phosphatase: 76 U/L (ref 39–117)
BUN: 13 mg/dL (ref 6–23)
CO2: 29 mEq/L (ref 19–32)
Calcium: 9.4 mg/dL (ref 8.4–10.5)
Chloride: 104 mEq/L (ref 96–112)
Creatinine, Ser: 0.62 mg/dL (ref 0.40–1.20)
GFR: 94.45 mL/min (ref >60.00)
Glucose, Bld: 88 mg/dL (ref 70–99)
Potassium: 4.4 mEq/L (ref 3.5–5.1)
Sodium: 141 mEq/L (ref 135–145)
Total Bilirubin: 0.4 mg/dL (ref 0.2–1.2)
Total Protein: 6.6 g/dL (ref 6.0–8.3)

## 2020-09-03 LAB — LIPID PANEL
Cholesterol: 212 mg/dL — ABNORMAL HIGH (ref 0–200)
HDL: 72 mg/dL (ref >39.00)
LDL Cholesterol: 128 mg/dL — ABNORMAL HIGH (ref 0–99)
NonHDL: 140.26
Total CHOL/HDL Ratio: 3
Triglycerides: 62 mg/dL (ref 0.0–149.0)
VLDL: 12.4 mg/dL (ref 0.0–40.0)

## 2020-09-03 LAB — TSH: TSH: 0.89 u[IU]/mL (ref 0.35–5.50)

## 2020-09-03 NOTE — Progress Notes (Signed)
Annual Exam   Chief Complaint:  Chief Complaint  Patient presents with   Annual Exam   Chest Pain    States that occasionally she gets tightness in her chest at rest, that doesn't last long. Does not occur after eating, sometimes it happens if she gets up in the middle of the night.     History of Present Illness:  [name] is a 65 y.o. No obstetric history on file. who LMP was No LMP recorded. Patient is postmenopausal., presents today for her annual examination.    #CP - youngest brother just had a heart attack at 56 - uncle, father, brother with sudden death MI - one with cardiac myopathy - chest tightness - comes on quickly and goes away quickly - at rest and when turning over in bed - no breathlessness  - never with activity  Nutrition She does not know get adequate calcium and Vitamin D in her diet. 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     The patient feels safe at home and in their relationships: yes.  Dentist: yes Eye: cataracts - seeing annually  Menstrual:  Symptoms of menopause: hot flashes   GYN She is single partner, contraception - post menopausal status.    Cervical Cancer Screening (21-65):   Last Pap:   October 2017 Results were: no abnormalities /neg HPV DNA   Breast Cancer Screening (Age 12-74):  There is no FH of breast cancer. There is no FH of ovarian cancer. BRCA screening Not Indicated.  Last Mammogram: 09/25/2019 The patient does want a mammogram this year.    Colon Cancer Screening:  Age 27-75 yo - benefits outweigh the risk. Adults 49-85 yo who have never been screened benefit.  Benefits: 134000 people in 2016 will be diagnosed and 49,000 will die - early detection helps [place]: Complications 2/2 to colonoscopy High Risk (Colonoscopy): genetic disorder (Lynch syndrome or familial adenomatous polyposis), personal hx of  IBD, previous adenomatous polyp, or previous colorectal cancer, FamHx start 10 years before the age at diagnosis, increased in males and black race  Options:  FIT - looks for hemoglobin (blood in the stool) - specific and fairly sensitive - must be done annually Cologuard - looks for DNA and blood - more sensitive - therefore can have more false positives, every 3 years Colonoscopy - every 10 years if normal - sedation, bowl prep, must have someone drive you  Shared decision making and the patient had decided to do colonoscopy - 2018 with 10 year f/u.   Social History   Tobacco Use  Smoking Status Never  Smokeless Tobacco Never    Lung Cancer Screening (Ages 76-73): not applicable   Weight Wt Readings from Last 3 Encounters:  09/03/20 129 lb 8 oz (58.7 kg)  09/02/19 131 lb 12.8 oz (59.8 kg)  04/29/19 130 lb (59 kg)   Patient has normal BMI  BMI Readings from Last 1 Encounters:  09/03/20 22.94 kg/m     Chronic disease screening Blood pressure monitoring:  BP Readings from Last 3 Encounters:  09/03/20 140/78  09/02/19 122/80  04/29/19 (!) 144/78    Lipid Monitoring: Indication for screening: age >9, obesity, diabetes, family hx, CV risk factors.  Lipid screening: Yes  Lab Results  Component Value Date   CHOL 220 (H) 09/02/2019   HDL 77.30 09/02/2019   LDLCALC 125 (H) 09/02/2019   TRIG 93.0  09/02/2019   CHOLHDL 3 09/02/2019     Diabetes Screening: age >16, overweight, family hx, PCOS, hx of gestational diabetes, at risk ethnicity Diabetes Screening screening: Yes  No results found for: HGBA1C   Past Medical History:  Diagnosis Date   Immunization, BCG    Osteoporosis    PVC (premature ventricular contraction)    UTI (urinary tract infection)     Past Surgical History:  Procedure Laterality Date   APPENDECTOMY     BREAST BIOPSY Left    over 20 years ago- Benign   BREAST SURGERY Left    benign breast biopsy   THUMB ARTHROSCOPY Bilateral     WISDOM TOOTH EXTRACTION      Prior to Admission medications   Medication Sig Start Date End Date Taking? Authorizing Provider  Multiple Vitamin (MULTI-VITAMIN DAILY PO) Take by mouth.   Yes [provider]  Turmeric 500 MG CAPS Take by mouth.   Yes [provider]    No Known Allergies  Gynecologic History: No LMP recorded. Patient is postmenopausal.  Obstetric History: No obstetric history on file.  Social History   Socioeconomic History   Marital status: Married    Spouse name: Louie Casa    Number of children: 2   Years of education: nursing degree   Highest education level: Not on file  Occupational History   Not on file  Tobacco Use   Smoking status: Never   Smokeless tobacco: Never  Vaping Use   Vaping Use: Never used  Substance and Sexual Activity   Alcohol use: Yes    Comment: holidays   Drug use: Never   Sexual activity: Yes    Birth control/protection: Post-menopausal  Other Topics Concern   Not on file  Social History Narrative   04/29/19   From: [place] and recently moved from [place]: with husband, Louie Casa   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      Family: Catering manager ([place]) and [place] ([place])        Enjoys: garden, Engineering geologist, hike      Exercise: exercises 3 times a week for 30 minutes as part of a study, yoga stretches daily   Diet: good, limits sweets, veggies/meat - chicken/fish, limits pasta      Safety   Seat belts: Yes    Guns: No   Safe in relationships: Yes    Social Determinants of Radio broadcast assistant Strain: Not on file  Food Insecurity: Not on file  Transportation Needs: Not on file  Physical Activity: Not on file  Stress: Not on file  Social Connections: Not on file  Intimate Partner Violence: Not on file    Family History  Problem Relation Age of Onset   Hyperlipidemia Mother    Alzheimer's disease Mother    Diabetes Father    Heart disease Father    Diabetes Brother    Kidney  failure Maternal Grandmother        following hysterectomy   Heart attack Maternal Grandfather 44   Stroke Paternal Grandmother 75   Lung cancer Paternal Grandfather    Diabetes Brother    Bladder Cancer Brother    Breast cancer Neg Hx     Review of Systems  Constitutional:  Negative for chills and fever.  HENT:  Negative for congestion and sore throat.   Eyes:  Negative for blurred vision and double vision.  Respiratory:  Negative for shortness of breath.   Cardiovascular:  Positive for chest pain.  Gastrointestinal:  Negative for heartburn, nausea and vomiting.  Genitourinary: Negative.   Musculoskeletal: Negative.  Negative for myalgias.  Skin:  Negative for rash.  Neurological:  Negative for dizziness and headaches.  Endo/Heme/Allergies:  Does not bruise/bleed easily.  Psychiatric/Behavioral:  Negative for depression. The patient is not nervous/anxious.     Physical Exam BP 140/78   Pulse 65   Temp (!) 97.5 F (36.4 C) (Temporal)   Ht 5' 3"  (1.6 m)   Wt 129 lb 8 oz (58.7 kg)   SpO2 97%   BMI 22.94 kg/m    BP Readings from Last 3 Encounters:  09/03/20 140/78  09/02/19 122/80  04/29/19 (!) 144/78      Physical Exam Constitutional:      General: She is not in acute distress.    Appearance: She is well-developed. She is not diaphoretic.  HENT:     Head: Normocephalic and atraumatic.     Right Ear: External ear normal.     Left Ear: External ear normal.     Nose: Nose normal.  Eyes:     General: No scleral icterus.    Extraocular Movements: Extraocular movements intact.     Conjunctiva/sclera: Conjunctivae normal.  Cardiovascular:     Rate and Rhythm: Normal rate and regular rhythm.     Heart sounds: No murmur heard. Pulmonary:     Effort: Pulmonary effort is normal. No respiratory distress.     Breath sounds: Normal breath sounds. No wheezing.  Abdominal:     General: Bowel sounds are normal. There is no distension.     Palpations: Abdomen is soft.  There is no mass.     Tenderness: There is no abdominal tenderness. There is no guarding or rebound.  Musculoskeletal:        General: Normal range of motion.     Cervical back: Neck supple.  Lymphadenopathy:     Cervical: No cervical adenopathy.  Skin:    General: Skin is warm and dry.     Capillary Refill: Capillary refill takes less than 2 seconds.  Neurological:     Mental Status: She is alert and oriented to person, place, and time.     Deep Tendon Reflexes: Reflexes normal.  Psychiatric:        Mood and Affect: Mood normal.        Behavior: Behavior normal.    Results:  PHQ-9:  [place] Office Visit from 09/02/2019 in [place] at [place]  PHQ-9 Total Score 0         Assessment: 65 y.o. No obstetric history on file. female here for routine annual physical examination.  Plan: Problem List Items Addressed This Visit       Musculoskeletal and Integument   Osteoporosis    Failed fosamax in her 47s. Would like to know about alternative treatments - specifically holistic. Will refer to endocrine to hear options. Cont exercise, vit d, calicium       Relevant Orders   Ambulatory referral to Endocrinology     Other   Family history of heart disease    Pt with intermittent cp at rest and night. But also several family members with cardiac death and cardiac myopathy. Referral to cards to help guide any preventative work-up she may need.       Relevant Orders   Comprehensive metabolic panel   Lipid panel   CBC   TSH   Other Visit Diagnoses     Chest tightness    -  Primary   Relevant Orders   Ambulatory referral to Cardiology   Comprehensive metabolic panel   Lipid panel   CBC   TSH   Annual physical exam       Relevant Orders   Comprehensive metabolic panel   Lipid panel   CBC   TSH   Mixed hyperlipidemia       Relevant Orders   Lipid panel       Screening: -- Blood pressure screen elevated: continued to monitor. --  cholesterol screening: will obtain -- Weight screening: normal -- Diabetes Screening: will obtain -- Nutrition: Encouraged healthy diet  The 10-year ASCVD risk score Mikey Bussing DC Jr., et al., 2013) is: 4.8%   Values used to calculate the score:     Age: 5 years     Sex: Female     Is Non-Hispanic African American: No     Diabetic: No     Tobacco smoker: No     Systolic Blood Pressure: 482 mmHg     Is BP treated: No     HDL Cholesterol: 77.3 mg/dL     Total Cholesterol: 220 mg/dL  -- Statin therapy for Age 70-75 with CVD risk >7.5%  Psych -- Depression screening (PHQ-9):  [place] Visit from 09/02/2019 in [place] at [place]  PHQ-9 Total Score 0        Safety -- tobacco screening: not using -- alcohol screening:  low-risk usage. -- no evidence of domestic violence or intimate partner violence.   Cancer Screening -- pap smear not collected per ASCCP guidelines -- family history of breast cancer screening: done. not at high risk. -- Mammogram -  pt will schedule -- Colon cancer (age 40+)--  obtain records, up to date per patient  Immunizations Immunization History  Administered Date(s) Administered   PFIZER(Purple Top)SARS-COV-2 Vaccination 03/14/2019, 04/11/2019, 12/06/2019, 06/19/2020   Tdap 11/30/2010, 08/06/2018   Zoster Recombinat (Shingrix) 09/02/2019, 11/13/2019    -- flu vaccine not up to date - declined -- TDAP q10 years up to date -- Shingles (age >70) up to date -- Covid-19 Vaccine up to date   Encouraged healthy diet and exercise. Encouraged regular vision and dental care.    Lesleigh Noe, MD

## 2020-09-03 NOTE — Assessment & Plan Note (Signed)
Failed fosamax in her 58s. Would like to know about alternative treatments - specifically holistic. Will refer to endocrine to hear options. Cont exercise, vit d, calicium

## 2020-09-03 NOTE — Patient Instructions (Addendum)
Would recommend the following for bone health:   1) 800 units of Vitamin D daily 2) Get 1200 mg of elemental calcium --- this is best from your diet. Try to track how much calcium you get on a typical day. You could find ways to add more (dairy products, leafy greens). Take a supplement for whatever you don't typically get so you reach 1200 mg of calcium.  3) Physical activity (ideally weight bearing) - like walking briskly 30 minutes 5 days a week.    #Referral I have placed a referral to a specialist for you. You should receive a phone call from the specialty office. Make sure your voicemail is not full and that if you are able to answer your phone to unknown or new numbers.   It may take up to 2 weeks to hear about the referral. If you do not hear anything in 2 weeks, please call our office and ask to speak with the referral coordinator.

## 2020-09-03 NOTE — Assessment & Plan Note (Signed)
Pt with intermittent cp at rest and night. But also several family members with cardiac death and cardiac myopathy. Referral to cards to help guide any preventative work-up she may need.

## 2020-09-08 ENCOUNTER — Telehealth: Payer: Self-pay

## 2020-09-08 NOTE — Telephone Encounter (Signed)
Left VM (Dpr) notifying pt that her work physical form is ready for pick up

## 2020-09-18 ENCOUNTER — Other Ambulatory Visit: Payer: Self-pay | Admitting: Family Medicine

## 2020-09-18 DIAGNOSIS — Z1231 Encounter for screening mammogram for malignant neoplasm of breast: Secondary | ICD-10-CM

## 2020-10-21 ENCOUNTER — Other Ambulatory Visit: Payer: Self-pay

## 2020-10-21 ENCOUNTER — Encounter: Payer: Self-pay | Admitting: Internal Medicine

## 2020-10-21 ENCOUNTER — Ambulatory Visit: Payer: 59 | Admitting: Internal Medicine

## 2020-10-21 VITALS — BP 160/90 | HR 72 | Ht 62.0 in | Wt 131.0 lb

## 2020-10-21 DIAGNOSIS — R0609 Other forms of dyspnea: Secondary | ICD-10-CM

## 2020-10-21 DIAGNOSIS — E785 Hyperlipidemia, unspecified: Secondary | ICD-10-CM

## 2020-10-21 DIAGNOSIS — R072 Precordial pain: Secondary | ICD-10-CM

## 2020-10-21 DIAGNOSIS — R079 Chest pain, unspecified: Secondary | ICD-10-CM | POA: Insufficient documentation

## 2020-10-21 DIAGNOSIS — R030 Elevated blood-pressure reading, without diagnosis of hypertension: Secondary | ICD-10-CM | POA: Diagnosis not present

## 2020-10-21 DIAGNOSIS — I493 Ventricular premature depolarization: Secondary | ICD-10-CM | POA: Diagnosis not present

## 2020-10-21 MED ORDER — METOPROLOL TARTRATE 100 MG PO TABS: 100.0000 mg | ORAL_TABLET | Freq: Once | ORAL | 0 refills | Status: DC

## 2020-10-21 NOTE — Patient Instructions (Signed)
Medication Instructions:  Your physician recommends that you continue on your current medications as directed. Please refer to the Current Medication list given to you today.  *If you need a refill on your cardiac medications before your next appointment, please call your pharmacy*   Lab Work:  BMET today  If you have labs (blood work) drawn today and your tests are completely normal, you will receive your results only by: [place] (if you have MyChart) OR A paper copy in the mail If you have any lab test that is abnormal or we need to change your treatment, we will call you to review the results.   Testing/Procedures:  1) Your physician has requested that you have an echocardiogram. Echocardiography is a painless test that uses sound waves to create images of your heart. It provides your doctor with information about the size and shape of your heart and how well your heart's chambers and valves are working. This procedure takes approximately one hour. There are no restrictions for this procedure.  2) Your cardiac CT will be scheduled at one of the below locations:   [facility] [address] [place], [place] [address] [phone]  [place] [address] [place], [place] [address] [phone]  If scheduled at [facility], please arrive at the [facility] main entrance (entrance A) of [facility] 30 minutes prior to test start time. You can use the FREE valet parking offered at the main entrance (encouraged to control the heart rate for the test) Proceed to the [facility] Radiology Department (first floor) to check-in and test prep.  If scheduled at [facility], please arrive 15 mins early for check-in and test prep.  Please follow these instructions carefully (unless otherwise directed):  On the Night Before the Test: Be sure to Drink plenty of water. Do  not consume any caffeinated/decaffeinated beverages or chocolate 12 hours prior to your test. Do not take any antihistamines 12 hours prior to your test.   On the Day of the Test: Drink plenty of water until 1 hour prior to the test. Do not eat any food 4 hours prior to the test. You may take your regular medications prior to the test.  Take metoprolol (Lopressor) two hours prior to test. FEMALES- please wear underwire-free bra if available, avoid dresses & tight clothing       After the Test: Drink plenty of water. After receiving IV contrast, you may experience a mild flushed feeling. This is normal. On occasion, you may experience a mild rash up to 24 hours after the test. This is not dangerous. If this occurs, you can take Benadryl 25 mg and increase your fluid intake. If you experience trouble breathing, this can be serious. If it is severe call 911 IMMEDIATELY. If it is mild, please call our office. If you take any of these medications: Glipizide/Metformin, Avandament, Glucavance, please do not take 48 hours after completing test unless otherwise instructed.  Please allow 2-4 weeks for scheduling of routine cardiac CTs. Some insurance companies require a pre-authorization which may delay scheduling of this test.   For non-scheduling related questions, please contact the cardiac imaging nurse navigator should you have any questions/concerns: Marchia Bond, Cardiac Imaging Nurse Navigator Gordy Clement, Cardiac Imaging Nurse Navigator [facility] Direct Office Dial: [phone]   For scheduling needs, including cancellations and rescheduling, please call [place], [phone].    Follow-Up:  At [facility], you and your health needs are our priority.  As part of our continuing mission to provide you with exceptional heart care, we have created designated Provider Care Teams.  These Care Teams include your primary Cardiologist (physician) and Advanced  Practice Providers (APPs -  Physician Assistants and Nurse Practitioners) who all work together to provide you with the care you need, when you need it.  We recommend signing up for the patient portal called "MyChart".  Sign up information is provided on this After Visit Summary.  MyChart is used to connect with patients for Virtual Visits (Telemedicine).  Patients are able to view lab/test results, encounter notes, upcoming appointments, etc.  Non-urgent messages can be sent to your provider as well.   To learn more about what you can do with MyChart, go to [email or web address].    Your next appointment:   2 month(s)  The format for your next appointment:   In Person  Provider:   You may see Dr. Harrell Gave End or one of the following Advanced Practice Providers on your designated Care Team:   Murray Hodgkins, NP Christell Faith, PA-C Marrianne Mood, PA-C [place], [place]

## 2020-10-21 NOTE — Progress Notes (Signed)
New Outpatient Visit Date: 10/21/2020  Referring Provider: Lesleigh Noe, MD [place],  [place] [address]  Chief Complaint: Chest pain and family history of heart disease  HPI:  Kimberly Cross is a 65 y.o. female who is being seen today for the evaluation of chest pain at the request of Dr. Einar Pheasant. She has a history of PVCs and osteoporosis.  She presents today noting intermittent tightness across her chest as well as pain in the left shoulder that has been present for years.  She frequently notices this when she is lying in bed.  Discomfort usually lasts a few minutes and resolves on its own.  She walks and kayaks regularly without chest pain.  She has experienced some exertional dyspnea, particularly when going upstairs, which began 2 months ago after she contracted COVID-19.  She also continues to have an "annoying cough" following COVID infection.  Ms. Winberg reports a history of PVCs that were diagnosed via 24-hour Holter monitor about 20 years ago while she was living in [place].  She continues to have occasional palpitations consistent with her PVCs, albeit less frequent than in the past.  There are no associated symptoms.  She has occasional orthostatic lightheadedness but has not passed out.  She denies significant edema and notes that her varicose veins were treated when she was in her late 39s.  --------------------------------------------------------------------------------------------------  Cardiovascular History & Procedures: Cardiovascular Problems: Chest pain Dyspnea on exertion  Risk Factors: Family history  Cath/PCI: None  CV Surgery: None  EP Procedures and Devices: 24-hour Holter monitor: Performed approximately 20 years ago, reportedly showing PVCs.  Non-Invasive Evaluation(s): Echo approximately 20 years ago reportedly showed mild tricuspid regurgitation.  Recent CV Pertinent Labs: Lab Results  Component Value Date   CHOL 212 (H) 09/03/2020   HDL 72.00  09/03/2020   LDLCALC 128 (H) 09/03/2020   TRIG 62.0 09/03/2020   CHOLHDL 3 09/03/2020   K 4.4 09/03/2020   BUN 13 09/03/2020   BUN 14 08/16/2016   CREATININE 0.62 09/03/2020    --------------------------------------------------------------------------------------------------  Past Medical History:  Diagnosis Date   Immunization, BCG    Osteoporosis    PVC (premature ventricular contraction)    UTI (urinary tract infection)     Past Surgical History:  Procedure Laterality Date   APPENDECTOMY     BREAST BIOPSY Left    over 20 years ago- Benign   BREAST SURGERY Left    benign breast biopsy   THUMB ARTHROSCOPY Bilateral    VARICOSE VEIN SURGERY     WISDOM TOOTH EXTRACTION      Current Meds  Medication Sig   Multiple Vitamin (MULTI-VITAMIN DAILY PO) Take 1 tablet by mouth daily.   Turmeric 500 MG CAPS Take 250 mg by mouth daily.    Allergies: Patient has no known allergies.  Social History   Tobacco Use   Smoking status: Never   Smokeless tobacco: Never  Vaping Use   Vaping Use: Never used  Substance Use Topics   Alcohol use: Yes    Comment: holidays   Drug use: Never    Family History  Problem Relation Age of Onset   Hyperlipidemia Mother    Alzheimer's disease Mother    Heart attack Father 62   Diabetes Father    Heart disease Father    Diabetes Brother    Diabetes Brother    Bladder Cancer Brother    Heart failure Brother        NICM  Heart attack Brother 66   Heart attack Paternal Uncle 46   Kidney failure Maternal Grandmother        following hysterectomy   Heart attack Maternal Grandfather 80   Stroke Paternal Grandmother 75   Lung cancer Paternal Grandfather    Breast cancer Neg Hx     Review of Systems: A 12-system review of systems was performed and was negative except as noted in the HPI.  --------------------------------------------------------------------------------------------------  Physical Exam: BP (!) 160/90 (BP Location:  Right Arm, Patient Position: Sitting, Cuff Size: Normal)   Pulse 72   Ht 5\' 2"  (1.575 m)   Wt 131 lb (59.4 kg)   SpO2 98%   BMI 23.96 kg/m   General: NAD. HEENT: No conjunctival pallor or scleral icterus. Facemask in place. Neck: Supple without lymphadenopathy, thyromegaly, JVD, or HJR. No carotid bruit. Lungs: Normal work of breathing. Clear to auscultation bilaterally without wheezes or crackles. Heart: Regular rate and rhythm without murmurs, rubs, or gallops. Non-displaced PMI. Abd: Bowel sounds present. Soft, NT/ND without hepatosplenomegaly Ext: No lower extremity edema. Radial, PT, and DP pulses are 2+ bilaterally Skin: Warm and dry without rash. Neuro: CNIII-XII intact. Strength and fine-touch sensation intact in upper and lower extremities bilaterally. Psych: Normal mood and affect.  EKG: Normal sinus rhythm without abnormality.  Lab Results  Component Value Date   WBC 5.6 09/03/2020   HGB 13.2 09/03/2020   HCT 39.1 09/03/2020   MCV 89.9 09/03/2020   PLT 223.0 09/03/2020    Lab Results  Component Value Date   NA 141 09/03/2020   K 4.4 09/03/2020   CL 104 09/03/2020   CO2 29 09/03/2020   BUN 13 09/03/2020   CREATININE 0.62 09/03/2020   GLUCOSE 88 09/03/2020   ALT 14 09/03/2020    Lab Results  Component Value Date   CHOL 212 (H) 09/03/2020   HDL 72.00 09/03/2020   LDLCALC 128 (H) 09/03/2020   TRIG 62.0 09/03/2020   CHOLHDL 3 09/03/2020     --------------------------------------------------------------------------------------------------  ASSESSMENT AND PLAN: Chest pain and dyspnea on exertion: Atypical chest pain has been present for years and is stable.  Exertional dyspnea is new over the last 2 months, having started after Ms. Memmott contracted COVID-19.  Her physical examination and EKG today are normal.  Her risk factors are limited, though she has several first-degree relatives with coronary artery disease and cardiomyopathy (though notably CAD is not  premature).  We have discussed further evaluation options and have agreed to obtain an echocardiogram and coronary CTA.  PVCs: Previously noted on Holter monitor while Ms. Minnie was residing in [place].  Symptoms are rare and without worrisome symptoms.  Defer medication changes today.  Proceed with echo and coronary CTA, as outlined above.  No plans for repeat event monitoring at this time.  Elevated blood pressure: Blood pressure moderately elevated today but typically better at home.  We will defer medication changes today, though this will need to be readdressed if blood pressure remains elevated at subsequent visits.  Hyperlipidemia: Recent lipid panel notable for mildly elevated LDL of 128.  Continue to work on diet and exercise.  If there is evidence of ASCVD on coronary CTA, addition of a statin will need to be considered.  Follow-up: Return to clinic in 2 months.  Nelva Bush, MD 10/21/2020 10:01 AM

## 2020-10-22 ENCOUNTER — Telehealth: Payer: Self-pay | Admitting: Internal Medicine

## 2020-10-22 DIAGNOSIS — E875 Hyperkalemia: Secondary | ICD-10-CM

## 2020-10-22 DIAGNOSIS — Z79899 Other long term (current) drug therapy: Secondary | ICD-10-CM

## 2020-10-22 LAB — BASIC METABOLIC PANEL
BUN/Creatinine Ratio: 23 (ref 12–28)
BUN: 12 mg/dL (ref 8–27)
CO2: 23 mmol/L (ref 20–29)
Calcium: 9.7 mg/dL (ref 8.7–10.3)
Chloride: 106 mmol/L (ref 96–106)
Creatinine, Ser: 0.52 mg/dL — ABNORMAL LOW (ref 0.57–1.00)
Glucose: 99 mg/dL (ref 70–99)
Potassium: 5.5 mmol/L — ABNORMAL HIGH (ref 3.5–5.2)
Sodium: 142 mmol/L (ref 134–144)
eGFR: 104 mL/min/{1.73_m2} (ref >59)

## 2020-10-22 NOTE — Telephone Encounter (Signed)
Kimberly Bush, MD  10/22/2020  6:35 AM EDT     Please let Kimberly Cross know that her kidney function is normal.  Her potassium is mildly elevated, though she is not on any medications that would cause this.  I suggest we repeat a BMP [facility] mall at her [facility], as I suspect the elevated potassium level may be a lab error.  It is okay to proceed with coronary CTA as discussed yesterday.

## 2020-10-22 NOTE — Telephone Encounter (Signed)
Attempted to call the patient. No answer- I left a message to please call back.  

## 2020-10-23 ENCOUNTER — Ambulatory Visit
Admission: RE | Admit: 2020-10-23 | Discharge: 2020-10-23 | Disposition: A | Discharge status: Home / Self Care | Payer: 59 | Source: Ambulatory Visit | Attending: Family Medicine | Admitting: Family Medicine

## 2020-10-23 ENCOUNTER — Other Ambulatory Visit
Admission: RE | Admit: 2020-10-23 | Discharge: 2020-10-23 | Disposition: A | Discharge status: Home / Self Care | Payer: 59 | Source: Ambulatory Visit | Attending: Internal Medicine | Admitting: Internal Medicine

## 2020-10-23 ENCOUNTER — Other Ambulatory Visit: Payer: Self-pay

## 2020-10-23 DIAGNOSIS — E875 Hyperkalemia: Secondary | ICD-10-CM | POA: Diagnosis not present

## 2020-10-23 DIAGNOSIS — Z79899 Other long term (current) drug therapy: Secondary | ICD-10-CM | POA: Insufficient documentation

## 2020-10-23 DIAGNOSIS — Z1231 Encounter for screening mammogram for malignant neoplasm of breast: Secondary | ICD-10-CM

## 2020-10-23 LAB — BASIC METABOLIC PANEL
Anion gap: 9 (ref 5–15)
BUN: 14 mg/dL (ref 8–23)
CO2: 26 mmol/L (ref 22–32)
Calcium: 9.7 mg/dL (ref 8.9–10.3)
Chloride: 105 mmol/L (ref 98–111)
Creatinine, Ser: 0.61 mg/dL (ref 0.44–1.00)
GFR, Estimated: >60 mL/min (ref >60)
Glucose, Bld: 112 mg/dL — ABNORMAL HIGH (ref 70–99)
Potassium: 3.9 mmol/L (ref 3.5–5.1)
Sodium: 140 mmol/L (ref 135–145)

## 2020-10-23 NOTE — Telephone Encounter (Signed)
Patient calling to discuss recent testing results  ° °Please call  ° °

## 2020-10-23 NOTE — Telephone Encounter (Signed)
Able to reach pt regarding her recent lab work, Dr. Saunders Revel had a chance to review their results and advised   "Please let Kimberly Cross know that her kidney function is normal.  Her potassium is mildly elevated, though she is not on any medications that would cause this.  I suggest we repeat a BMP [facility] medical mall at her convenience, as I suspect the elevated potassium level may be a lab error.  It is okay to proceed with coronary CTA as discussed yesterday."  Kimberly Cross thankful for reaching out to her, will get labs drawn later today or Monday, otherwise, all questions or were address and no additional concerns at this time. Agreeable to plan, will call back for anything further.    BMP placed for [place]

## 2020-11-05 ENCOUNTER — Ambulatory Visit: Payer: 59

## 2020-11-19 ENCOUNTER — Other Ambulatory Visit: Payer: Self-pay

## 2020-11-19 ENCOUNTER — Ambulatory Visit
Admission: RE | Admit: 2020-11-19 | Discharge: 2020-11-19 | Disposition: A | Discharge status: Home / Self Care | Payer: 59 | Source: Ambulatory Visit | Attending: Internal Medicine | Admitting: Internal Medicine

## 2020-11-19 DIAGNOSIS — R0609 Other forms of dyspnea: Secondary | ICD-10-CM | POA: Insufficient documentation

## 2020-11-19 DIAGNOSIS — R072 Precordial pain: Secondary | ICD-10-CM | POA: Insufficient documentation

## 2020-11-19 MED ORDER — NITROGLYCERIN 0.4 MG SL SUBL
0.8000 mg | SUBLINGUAL_TABLET | Freq: Once | SUBLINGUAL | Status: AC
  Administered 2020-11-19: 16:00:00 0.8 mg via SUBLINGUAL

## 2020-11-19 MED ORDER — IOHEXOL 350 MG/ML SOLN
75.0000 mL | Freq: Once | INTRAVENOUS | Status: AC | PRN
  Administered 2020-11-19: 16:00:00 75 mL via INTRAVENOUS

## 2020-11-19 NOTE — Progress Notes (Signed)
Patient tolerated CT well. Drank water after. Vital signs stable encourage to drink water throughout day.Reasons explained and verbalized understanding. Ambulated steady gait.  

## 2020-11-25 ENCOUNTER — Other Ambulatory Visit: Payer: Self-pay

## 2020-11-25 ENCOUNTER — Ambulatory Visit (INDEPENDENT_AMBULATORY_CARE_PROVIDER_SITE_OTHER): Payer: 59

## 2020-11-25 DIAGNOSIS — R0609 Other forms of dyspnea: Secondary | ICD-10-CM

## 2020-11-25 DIAGNOSIS — R072 Precordial pain: Secondary | ICD-10-CM | POA: Diagnosis not present

## 2020-11-25 LAB — ECHOCARDIOGRAM COMPLETE
AR max vel: 2.38 cm2
AV Area VTI: 1.96 cm2
AV Area mean vel: 2.06 cm2
AV Mean grad: 5 mmHg
AV Peak grad: 8.9 mmHg
Ao pk vel: 1.49 m/s
Area-P 1/2: 4.17 cm2
Calc EF: 62.5 %
S' Lateral: 2.3 cm
Single Plane A2C EF: 61.4 %
Single Plane A4C EF: 64.2 %

## 2020-12-09 ENCOUNTER — Other Ambulatory Visit: Payer: Self-pay

## 2020-12-09 ENCOUNTER — Ambulatory Visit: Payer: 59 | Admitting: Internal Medicine

## 2020-12-09 ENCOUNTER — Encounter: Payer: Self-pay | Admitting: Internal Medicine

## 2020-12-09 VITALS — BP 140/80 | HR 69 | Ht 62.0 in | Wt 130.0 lb

## 2020-12-09 DIAGNOSIS — R030 Elevated blood-pressure reading, without diagnosis of hypertension: Secondary | ICD-10-CM

## 2020-12-09 DIAGNOSIS — I493 Ventricular premature depolarization: Secondary | ICD-10-CM | POA: Diagnosis not present

## 2020-12-09 DIAGNOSIS — R079 Chest pain, unspecified: Secondary | ICD-10-CM

## 2020-12-09 NOTE — Progress Notes (Signed)
 Follow-up Outpatient Visit Date: 12/09/2020  Primary Care Provider: Cody, Jessica R, MD [address] [place] [address]  Chief Complaint: Follow-up chest pain and palpitations  HPI:  Kimberly Cross is a 65 y.o. female with history of PVCs and osteoporosis, who presents for follow-up of chest pain and family history of heart disease.  I met Kimberly Cross in mid October, at which time she noted intermittent tightness across her chest and pain in the left shoulder.  She also recounted a long history of PVCs diagnosed by Holter monitoring 20 years ago while she was living in [place].  She continued to note occasional palpitations, less frequent than in the past.  Subsequent coronary CTA was normal without evidence of CAD.  Echocardiogram showed normal LVEF with grade 1 diastolic dysfunction as well as mild mitral and tricuspid regurgitation.  Today, Kimberly Cross reports that she has been feeling fairly well.  She still notes sporadic palpitations and episodes of chest tightness that occur randomly.  She denies exertional symptoms.  Exertional dyspnea since she had COVID-19 is stable.  --------------------------------------------------------------------------------------------------  Cardiovascular History & Procedures: Cardiovascular Problems: Chest pain Dyspnea on exertion   Risk Factors: Family history   Cath/PCI: None   CV Surgery: None   EP Procedures and Devices: 24-hour Holter monitor: Performed approximately 20 years ago, reportedly showing PVCs.   Non-Invasive Evaluation(s): TTE (11/25/2020): Normal LV size and wall thickness.  LVEF 60-65% with grade 1 diastolic dysfunction and normal wall motion.  Normal RV size and function.  Normal PA pressure.  Normal biatrial size.  Mild mitral and tricuspid regurgitation.  Normal CVP. Coronary CTA (11/19/2020): Normal coronary arteries.  Coronary calcium score 0.  No significant extracardiac abnormality in the visualized thorax.  Recent CV  Pertinent Labs: Lab Results  Component Value Date   CHOL 212 (H) 09/03/2020   HDL 72.00 09/03/2020   LDLCALC 128 (H) 09/03/2020   TRIG 62.0 09/03/2020   CHOLHDL 3 09/03/2020   K 3.9 10/23/2020   BUN 14 10/23/2020   BUN 12 10/21/2020   CREATININE 0.61 10/23/2020    Past medical and surgical history were reviewed and updated in EPIC.  Current Meds  Medication Sig   Multiple Vitamin (MULTI-VITAMIN DAILY PO) Take 1 tablet by mouth daily.   Turmeric 500 MG CAPS Take 250 mg by mouth daily.    Allergies: Patient has no known allergies.  Social History   Tobacco Use   Smoking status: Never   Smokeless tobacco: Never  Vaping Use   Vaping Use: Never used  Substance Use Topics   Alcohol use: Not Currently    Comment: 1-2 drinks per year   Drug use: Never    Family History  Problem Relation Age of Onset   Hyperlipidemia Mother    Alzheimer's disease Mother    Heart attack Father 66   Diabetes Father    Heart disease Father    Diabetes Brother    Diabetes Brother    Bladder Cancer Brother    Heart failure Brother        NICM   Heart attack Brother 66   Heart attack Paternal Uncle 46   Kidney failure Maternal Grandmother        following hysterectomy   Heart attack Maternal Grandfather 80   Stroke Paternal Grandmother 75   Lung cancer Paternal Grandfather    Breast cancer Neg Hx     Review of Systems: A 12-system review of systems was performed and was negative   except as noted in the HPI.  --------------------------------------------------------------------------------------------------  Physical Exam: BP 140/80 (BP Location: Left Arm, Patient Position: Sitting, Cuff Size: Normal)   Pulse 69   Ht 5' 2" (1.575 m)   Wt 130 lb (59 kg)   SpO2 98%   BMI 23.78 kg/m   General:  NAD. Neck: No JVD or HJR. Lungs: Clear to auscultation bilaterally without wheezes or crackles. Heart: Regular rate and rhythm without murmurs, rubs, or gallops. Abdomen: Soft,  nontender, nondistended. Extremities: No lower extremity edema.  Lab Results  Component Value Date   WBC 5.6 09/03/2020   HGB 13.2 09/03/2020   HCT 39.1 09/03/2020   MCV 89.9 09/03/2020   PLT 223.0 09/03/2020    Lab Results  Component Value Date   NA 140 10/23/2020   K 3.9 10/23/2020   CL 105 10/23/2020   CO2 26 10/23/2020   BUN 14 10/23/2020   CREATININE 0.61 10/23/2020   GLUCOSE 112 (H) 10/23/2020   ALT 14 09/03/2020    Lab Results  Component Value Date   CHOL 212 (H) 09/03/2020   HDL 72.00 09/03/2020   LDLCALC 128 (H) 09/03/2020   TRIG 62.0 09/03/2020   CHOLHDL 3 09/03/2020    --------------------------------------------------------------------------------------------------  ASSESSMENT AND PLAN: Chest pain: Pain is not consistent with with angina.  Recent coronary CTA was normal without CAD.  Echo was without significant structural abnormality.  No further workup or intervention recommended.  PVC's: Minimal palpitations noted.  No further intervention recommended at this time.  Elevated BP reading: [name] reports a history of white coat hypertension.  BP borderline today.  I encouraged her to limit her sodium and continue to remain active.  Ongoing surveillance per Dr. Einar Pheasant.  Follow-up: Return to clinic as needed.  Nelva Bush, MD 12/09/2020 3:43 PM

## 2020-12-09 NOTE — Patient Instructions (Signed)
Medication Instructions:   Your physician recommends that you continue on your current medications as directed. Please refer to the Current Medication list given to you today.  *If you need a refill on your cardiac medications before your next appointment, please call your pharmacy*   Lab Work:  None ordered  Testing/Procedures:  None ordered   Follow-Up: At [facility], you and your health needs are our priority.  As part of our continuing mission to provide you with exceptional heart care, we have created designated Provider Care Teams.  These Care Teams include your primary Cardiologist (physician) and Advanced Practice Providers (APPs -  Physician Assistants and Nurse Practitioners) who all work together to provide you with the care you need, when you need it.  We recommend signing up for the patient portal called "MyChart".  Sign up information is provided on this After Visit Summary.  MyChart is used to connect with patients for Virtual Visits (Telemedicine).  Patients are able to view lab/test results, encounter notes, upcoming appointments, etc.  Non-urgent messages can be sent to your provider as well.   To learn more about what you can do with MyChart, go to [email or web address].    Your next appointment:    Follow up as needed

## 2021-03-17 ENCOUNTER — Other Ambulatory Visit (HOSPITAL_COMMUNITY)
Admission: RE | Admit: 2021-03-17 | Discharge: 2021-03-17 | Disposition: A | Discharge status: Home / Self Care | Payer: 59 | Source: Ambulatory Visit | Attending: Family Medicine | Admitting: Family Medicine

## 2021-03-17 ENCOUNTER — Encounter: Payer: Self-pay | Admitting: Family Medicine

## 2021-03-17 ENCOUNTER — Ambulatory Visit (INDEPENDENT_AMBULATORY_CARE_PROVIDER_SITE_OTHER): Payer: 59 | Admitting: Family Medicine

## 2021-03-17 ENCOUNTER — Other Ambulatory Visit: Payer: Self-pay

## 2021-03-17 VITALS — BP 138/72 | HR 74 | Temp 97.8°F | Wt 133.4 lb

## 2021-03-17 DIAGNOSIS — M810 Age-related osteoporosis without current pathological fracture: Secondary | ICD-10-CM | POA: Diagnosis not present

## 2021-03-17 DIAGNOSIS — Z124 Encounter for screening for malignant neoplasm of cervix: Secondary | ICD-10-CM | POA: Diagnosis present

## 2021-03-17 NOTE — Assessment & Plan Note (Signed)
Is planning to see endocrinology, is taking vitamin D and calcium.  And walking daily. ?

## 2021-03-17 NOTE — Progress Notes (Signed)
? ?  Subjective:  ? ?  ?Kimberly Cross is a 66 y.o. female presenting for Gynecologic Exam ?  ? ? ?Gynecologic Exam ? ? ?#Activity ?- doing kayaking and walking ?  ? ?#osteoporosis ?- walking ?- taking Vit D and calcium ?- doing stretches ?- planning to see endocrinology ? ?Had covid in September ? ?Review of Systems ? ? ?Social History  ? ?Tobacco Use  ?Smoking Status Never  ?Smokeless Tobacco Never  ? ? ? ?   ?Objective:  ?  ?BP Readings from Last 3 Encounters:  ?03/17/21 138/72  ?12/09/20 140/80  ?11/19/20 135/63  ? ?Wt Readings from Last 3 Encounters:  ?03/17/21 133 lb 6 oz (60.5 kg)  ?12/09/20 130 lb (59 kg)  ?10/21/20 131 lb (59.4 kg)  ? ? ?BP 138/72   Pulse 74   Temp 97.8 ?F (36.6 ?C) (Oral)   Wt 133 lb 6 oz (60.5 kg)   SpO2 98%   BMI 24.39 kg/m?  ? ? ?Physical Exam ?Constitutional:   ?   General: She is not in acute distress. ?   Appearance: She is well-developed. She is not diaphoretic.  ?HENT:  ?   Right Ear: External ear normal.  ?   Left Ear: External ear normal.  ?   Nose: Nose normal.  ?Eyes:  ?   Conjunctiva/sclera: Conjunctivae normal.  ?Cardiovascular:  ?   Rate and Rhythm: Normal rate.  ?Pulmonary:  ?   Effort: Pulmonary effort is normal.  ?Genitourinary: ?   Vagina: Normal.  ?   Cervix: Normal.  ?Musculoskeletal:  ?   Cervical back: Neck supple.  ?Skin: ?   General: Skin is warm and dry.  ?   Capillary Refill: Capillary refill takes less than 2 seconds.  ?Neurological:  ?   Mental Status: She is alert. Mental status is at baseline.  ?Psychiatric:     ?   Mood and Affect: Mood normal.     ?   Behavior: Behavior normal.  ? ? ? ? ? ?   ?Assessment & Plan:  ? ?Problem List Items Addressed This Visit   ? ?  ? Musculoskeletal and Integument  ? Osteoporosis - Primary  ?  Is planning to see endocrinology, is taking vitamin D and calcium.  And walking daily. ?  ?  ? Relevant Medications  ? Calcium Citrate-Vitamin D (CALCIUM + D) 315-5 MG-MCG TABS  ? ?Other Visit Diagnoses   ? ? Cervical cancer screening       ? Relevant Orders  ? Cytology - HPV w/PAP any interp. Reflex 16/18 genotyping if HPV + ([facility])  ? ?  ? ? ? ?Return in about 6 months (around 09/17/2021) for medicare wellness. ? ?Lesleigh Noe, MD ? ?This visit occurred during the SARS-CoV-2 public health emergency.  Safety protocols were in place, including screening questions prior to the visit, additional usage of staff PPE, and extensive cleaning of exam room while observing appropriate contact time as indicated for disinfecting solutions.  ? ?

## 2021-03-22 LAB — CYTOLOGY - PAP
Comment: NEGATIVE
Diagnosis: NEGATIVE
High risk HPV: NEGATIVE

## 2021-09-09 ENCOUNTER — Ambulatory Visit (INDEPENDENT_AMBULATORY_CARE_PROVIDER_SITE_OTHER): Payer: 59 | Admitting: Family Medicine

## 2021-09-09 VITALS — BP 120/60 | HR 77 | Temp 97.5°F | Ht 62.0 in | Wt 131.0 lb

## 2021-09-09 DIAGNOSIS — Z1231 Encounter for screening mammogram for malignant neoplasm of breast: Secondary | ICD-10-CM

## 2021-09-09 DIAGNOSIS — M810 Age-related osteoporosis without current pathological fracture: Secondary | ICD-10-CM

## 2021-09-09 DIAGNOSIS — R232 Flushing: Secondary | ICD-10-CM | POA: Diagnosis not present

## 2021-09-09 DIAGNOSIS — Z0000 Encounter for general adult medical examination without abnormal findings: Secondary | ICD-10-CM

## 2021-09-09 DIAGNOSIS — E782 Mixed hyperlipidemia: Secondary | ICD-10-CM

## 2021-09-09 LAB — COMPREHENSIVE METABOLIC PANEL
ALT: 13 U/L (ref 0–35)
AST: 17 U/L (ref 0–37)
Albumin: 4 g/dL (ref 3.5–5.2)
Alkaline Phosphatase: 76 U/L (ref 39–117)
BUN: 14 mg/dL (ref 6–23)
CO2: 29 mEq/L (ref 19–32)
Calcium: 9.4 mg/dL (ref 8.4–10.5)
Chloride: 103 mEq/L (ref 96–112)
Creatinine, Ser: 0.66 mg/dL (ref 0.40–1.20)
GFR: 92.38 mL/min (ref >60.00)
Glucose, Bld: 90 mg/dL (ref 70–99)
Potassium: 4.8 mEq/L (ref 3.5–5.1)
Sodium: 139 mEq/L (ref 135–145)
Total Bilirubin: 0.4 mg/dL (ref 0.2–1.2)
Total Protein: 6.5 g/dL (ref 6.0–8.3)

## 2021-09-09 LAB — LIPID PANEL
Cholesterol: 215 mg/dL — ABNORMAL HIGH (ref 0–200)
HDL: 65.4 mg/dL (ref >39.00)
LDL Cholesterol: 125 mg/dL — ABNORMAL HIGH (ref 0–99)
NonHDL: 149.9
Total CHOL/HDL Ratio: 3
Triglycerides: 126 mg/dL (ref 0.0–149.0)
VLDL: 25.2 mg/dL (ref 0.0–40.0)

## 2021-09-09 LAB — CBC
HCT: 40.6 % (ref 36.0–46.0)
Hemoglobin: 13.5 g/dL (ref 12.0–15.0)
MCHC: 33.3 g/dL (ref 30.0–36.0)
MCV: 90.6 fl (ref 78.0–100.0)
Platelets: 219 10*3/uL (ref 150.0–400.0)
RBC: 4.48 Mil/uL (ref 3.87–5.11)
RDW: 13.1 % (ref 11.5–15.5)
WBC: 4.8 10*3/uL (ref 4.0–10.5)

## 2021-09-09 LAB — TSH: TSH: 1.07 u[IU]/mL (ref 0.35–5.50)

## 2021-09-09 NOTE — Progress Notes (Signed)
Annual Exam   Chief Complaint:  Chief Complaint  Patient presents with   Annual Exam    No concerns    History of Present Illness:  [name] is a 66 y.o. No obstetric history on file. who LMP was No LMP recorded. Patient is postmenopausal., presents today for her annual examination.     Nutrition She does get adequate calcium and Vitamin D in her diet. Diet: healthy Exercise: kayak, biking, hiking    Social History   Tobacco Use  Smoking Status Never  Smokeless Tobacco Never   Social History   Substance and Sexual Activity  Alcohol Use Not Currently   Comment: 1-2 drinks per year   Social History   Substance and Sexual Activity  Drug Use Never     General Health Dentist in the last year: Yes Eye doctor: yes  Safety The patient wears seatbelts: yes.     The patient feels safe at home and in their relationships: yes.   Menstrual:  Symptoms of menopause: continues to have some hot flashes at night and work - seems to be gradually getting worse   GYN She is single partner, contraception - post menopausal status.   Cervical Cancer Screening (21-65):   Last Pap:   March 2023 Results were: no abnormalities /neg HPV DNA   Breast Cancer Screening (Age 30-74):  There is no FH of breast cancer. There is no FH of ovarian cancer. BRCA screening Not Indicated.  Last Mammogram: 10/2020 The patient does want a mammogram this year.    Colon Cancer Screening:  Age 96-75 yo - benefits outweigh the risk. Adults 72-85 yo who have never been screened benefit.  Benefits: 134000 people in 2016 will be diagnosed and 49,000 will die - early detection helps [place]: Complications 2/2 to colonoscopy High Risk (Colonoscopy): genetic disorder (Lynch syndrome or familial adenomatous polyposis), personal hx of IBD, previous adenomatous polyp, or previous colorectal cancer, FamHx start 10 years before the age at diagnosis, increased in males and black race  Options:  FIT -  looks for hemoglobin (blood in the stool) - specific and fairly sensitive - must be done annually Cologuard - looks for DNA and blood - more sensitive - therefore can have more false positives, every 3 years Colonoscopy - every 10 years if normal - sedation, bowl prep, must have someone drive you  Shared decision making and the patient had decided to do colonoscopy - due in 6 years.   Social History   Tobacco Use  Smoking Status Never  Smokeless Tobacco Never    Lung Cancer Screening (Ages 11-03): not applicable  Weight Wt Readings from Last 3 Encounters:  09/09/21 131 lb (59.4 kg)  03/17/21 133 lb 6 oz (60.5 kg)  12/09/20 130 lb (59 kg)   Patient has normal BMI  BMI Readings from Last 1 Encounters:  09/09/21 23.96 kg/m     Chronic disease screening Blood pressure monitoring:  BP Readings from Last 3 Encounters:  09/09/21 120/60  03/17/21 138/72  12/09/20 140/80    Lipid Monitoring: Indication for screening: age >38, obesity, diabetes, family hx, CV risk factors.  Lipid screening: Yes  Lab Results  Component Value Date   CHOL 212 (H) 09/03/2020   HDL 72.00 09/03/2020   LDLCALC 128 (H) 09/03/2020   TRIG 62.0 09/03/2020   CHOLHDL 3 09/03/2020     Diabetes Screening: age >63, overweight, family hx, PCOS, hx of gestational diabetes, at risk ethnicity Diabetes Screening screening: Yes  No  results found for: "HGBA1C"   Past Medical History:  Diagnosis Date   Immunization, BCG    Osteoporosis    PVC (premature ventricular contraction)    UTI (urinary tract infection)     Past Surgical History:  Procedure Laterality Date   APPENDECTOMY     BREAST BIOPSY Left    over 20 years ago- Benign   BREAST SURGERY Left    benign breast biopsy   THUMB ARTHROSCOPY Bilateral    VARICOSE VEIN SURGERY     WISDOM TOOTH EXTRACTION      Prior to Admission medications   Medication Sig Start Date End Date Taking? Authorizing Provider  Calcium Citrate-Vitamin D  (CALCIUM + D) 315-5 MG-MCG TABS Take 1,300 mg by mouth daily.   Yes [provider]  Multiple Vitamin (MULTI-VITAMIN DAILY PO) Take 1 tablet by mouth daily.   Yes [provider]  Turmeric 500 MG CAPS Take 250 mg by mouth daily.   Yes [provider]    No Known Allergies  Gynecologic History: No LMP recorded. Patient is postmenopausal.  Obstetric History: No obstetric history on file.  Social History   Socioeconomic History   Marital status: Married    Spouse name: Louie Casa    Number of children: 2   Years of education: nursing degree   Highest education level: Not on file  Occupational History   Not on file  Tobacco Use   Smoking status: Never   Smokeless tobacco: Never  Vaping Use   Vaping Use: Never used  Substance and Sexual Activity   Alcohol use: Not Currently    Comment: 1-2 drinks per year   Drug use: Never   Sexual activity: Yes    Birth control/protection: Post-menopausal  Other Topics Concern   Not on file  Social History Narrative   04/29/19   From: [place] and recently moved from [place]: with husband, Louie Casa   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      Family: Catering manager ([place]) and [place] ([place])        Enjoys: garden, Engineering geologist, hike      Exercise: exercises 3 times a week for 30 minutes as part of a study, yoga stretches daily   Diet: good, limits sweets, veggies/meat - chicken/fish, limits pasta      Safety   Seat belts: Yes    Guns: No   Safe in relationships: Yes    Social Determinants of Radio broadcast assistant Strain: Not on file  Food Insecurity: Not on file  Transportation Needs: Not on file  Physical Activity: Not on file  Stress: Not on file  Social Connections: Not on file  Intimate Partner Violence: Not on file    Family History  Problem Relation Age of Onset   Hyperlipidemia Mother    Alzheimer's disease Mother    Heart attack Father 15   Diabetes Father    Heart disease  Father    Diabetes Brother    Diabetes Brother    Bladder Cancer Brother    Heart failure Brother        NICM   Heart attack Brother 79   Heart attack Paternal Uncle 46   Kidney failure Maternal Grandmother        following hysterectomy   Heart attack Maternal Grandfather 62   Stroke Paternal Grandmother 75   Lung cancer Paternal Grandfather    Breast cancer Neg Hx     Review of Systems  Constitutional:  Negative  for chills and fever.  HENT:  Negative for congestion and sore throat.   Eyes:  Negative for blurred vision and double vision.  Respiratory:  Negative for shortness of breath.   Cardiovascular:  Negative for chest pain.  Gastrointestinal:  Negative for heartburn, nausea and vomiting.  Genitourinary: Negative.   Musculoskeletal: Negative.  Negative for myalgias.  Skin:  Negative for rash.  Neurological:  Negative for dizziness and headaches.  Endo/Heme/Allergies:  Does not bruise/bleed easily.  Psychiatric/Behavioral:  Negative for depression. The patient is not nervous/anxious.      Physical Exam BP 120/60   Pulse 77   Temp (!) 97.5 F (36.4 C) (Temporal)   Ht 5' 2" (1.575 m)   Wt 131 lb (59.4 kg)   SpO2 98%   BMI 23.96 kg/m    BP Readings from Last 3 Encounters:  09/09/21 120/60  03/17/21 138/72  12/09/20 140/80      Physical Exam Constitutional:      General: She is not in acute distress.    Appearance: She is well-developed. She is not diaphoretic.  HENT:     Head: Normocephalic and atraumatic.     Right Ear: External ear normal.     Left Ear: External ear normal.     Nose: Nose normal.  Eyes:     General: No scleral icterus.    Extraocular Movements: Extraocular movements intact.     Conjunctiva/sclera: Conjunctivae normal.  Cardiovascular:     Rate and Rhythm: Normal rate and regular rhythm.     Heart sounds: No murmur heard. Pulmonary:     Effort: Pulmonary effort is normal. No respiratory distress.     Breath sounds: Normal breath  sounds. No wheezing.  Abdominal:     General: Bowel sounds are normal. There is no distension.     Palpations: Abdomen is soft. There is no mass.     Tenderness: There is no abdominal tenderness. There is no guarding or rebound.  Musculoskeletal:        General: Normal range of motion.     Cervical back: Neck supple.  Lymphadenopathy:     Cervical: No cervical adenopathy.  Skin:    General: Skin is warm and dry.     Capillary Refill: Capillary refill takes less than 2 seconds.  Neurological:     Mental Status: She is alert and oriented to person, place, and time.     Deep Tendon Reflexes: Reflexes normal.  Psychiatric:        Mood and Affect: Mood normal.        Behavior: Behavior normal.     Results:  PHQ-9:  [place] Office Visit from 09/02/2019 in [place] at [place]  PHQ-9 Total Score 0         Assessment: 66 y.o. No obstetric history on file. female here for routine annual physical examination.  Plan: Problem List Items Addressed This Visit       Musculoskeletal and Integument   Osteoporosis    Repeat Dexa. Cont Calcium/D and weight bearing activity. Failed fosamax. Referral to endocrine.       Relevant Orders   Ambulatory referral to Endocrinology   DG Bone Density     Other   Hyperlipidemia   Relevant Orders   Comprehensive metabolic panel   Lipid panel   Other Visit Diagnoses     Annual physical exam    -  Primary   Relevant Orders   CBC   Encounter for screening mammogram for  malignant neoplasm of breast       Relevant Orders   MM 3D SCREEN BREAST BILATERAL   Hot flashes       Relevant Orders   TSH       Screening: -- Blood pressure screen normal -- cholesterol screening: will obtain -- Weight screening: normal -- Diabetes Screening: will obtain -- Nutrition: Encouraged healthy diet  The 10-year ASCVD risk score (Arnett DK, et al., 2019) is: 4%   Values used to calculate the score:     Age: 55 years     Sex:  Female     Is Non-Hispanic African American: No     Diabetic: No     Tobacco smoker: No     Systolic Blood Pressure: 384 mmHg     Is BP treated: No     HDL Cholesterol: 72 mg/dL     Total Cholesterol: 212 mg/dL  -- Statin therapy for Age 68-75 with CVD risk >7.5%  Psych -- Depression screening (PHQ-9):  [place] Visit from 09/02/2019 in [place] at [place]  PHQ-9 Total Score 0        Safety -- tobacco screening: not using -- alcohol screening:  low-risk usage. -- no evidence of domestic violence or intimate partner violence.   Cancer Screening -- pap smear not collected per ASCCP guidelines -- family history of breast cancer screening: done. not at high risk. -- Mammogram - ordered -- Colon cancer (age 40+)--  records requested  Immunizations Immunization History  Administered Date(s) Administered   PFIZER(Purple Top)SARS-COV-2 Vaccination 03/14/2019, 04/11/2019, 12/06/2019, 06/19/2020   Tdap 11/30/2010, 08/06/2018   Zoster Recombinat (Shingrix) 09/02/2019, 11/13/2019    -- flu vaccine not up to date - declined -- TDAP q10 years up to date -- Shingles (age >13) up to date -- Covid-19 Vaccine up to date   Encouraged healthy diet and exercise. Encouraged regular vision and dental care.    Lesleigh Noe, MD

## 2021-09-09 NOTE — Patient Instructions (Signed)
Labs today  #Referral I have placed a referral to a specialist for you. You should receive a phone call from the specialty office. Make sure your voicemail is not full and that if you are able to answer your phone to unknown or new numbers.   It may take up to 2 weeks to hear about the referral. If you do not hear anything in 2 weeks, please call our office and ask to speak with the referral coordinator.

## 2021-09-09 NOTE — Assessment & Plan Note (Signed)
Repeat Dexa. Cont Calcium/D and weight bearing activity. Failed fosamax. Referral to endocrine.

## 2021-11-02 ENCOUNTER — Ambulatory Visit: Payer: 59

## 2021-12-08 DIAGNOSIS — H5203 Hypermetropia, bilateral: Secondary | ICD-10-CM | POA: Diagnosis not present

## 2021-12-08 DIAGNOSIS — Z135 Encounter for screening for eye and ear disorders: Secondary | ICD-10-CM | POA: Diagnosis not present

## 2021-12-08 DIAGNOSIS — H52223 Regular astigmatism, bilateral: Secondary | ICD-10-CM | POA: Diagnosis not present

## 2021-12-08 DIAGNOSIS — Z961 Presence of intraocular lens: Secondary | ICD-10-CM | POA: Diagnosis not present

## 2021-12-08 DIAGNOSIS — H04123 Dry eye syndrome of bilateral lacrimal glands: Secondary | ICD-10-CM | POA: Diagnosis not present

## 2021-12-08 DIAGNOSIS — H524 Presbyopia: Secondary | ICD-10-CM | POA: Diagnosis not present

## 2021-12-09 ENCOUNTER — Ambulatory Visit (INDEPENDENT_AMBULATORY_CARE_PROVIDER_SITE_OTHER): Payer: Medicare HMO | Admitting: Family

## 2021-12-09 ENCOUNTER — Other Ambulatory Visit: Payer: Self-pay

## 2021-12-09 ENCOUNTER — Encounter: Payer: Self-pay | Admitting: Family

## 2021-12-09 VITALS — BP 134/78 | HR 79 | Temp 97.5°F | Ht 62.0 in | Wt 134.2 lb

## 2021-12-09 DIAGNOSIS — M810 Age-related osteoporosis without current pathological fracture: Secondary | ICD-10-CM

## 2021-12-09 DIAGNOSIS — R0683 Snoring: Secondary | ICD-10-CM | POA: Insufficient documentation

## 2021-12-09 DIAGNOSIS — I493 Ventricular premature depolarization: Secondary | ICD-10-CM | POA: Diagnosis not present

## 2021-12-09 DIAGNOSIS — L659 Nonscarring hair loss, unspecified: Secondary | ICD-10-CM | POA: Insufficient documentation

## 2021-12-09 DIAGNOSIS — Z23 Encounter for immunization: Secondary | ICD-10-CM

## 2021-12-09 DIAGNOSIS — L259 Unspecified contact dermatitis, unspecified cause: Secondary | ICD-10-CM | POA: Diagnosis not present

## 2021-12-09 LAB — CBC
HCT: 39.2 % (ref 36.0–46.0)
Hemoglobin: 13.5 g/dL (ref 12.0–15.0)
MCHC: 34.4 g/dL (ref 30.0–36.0)
MCV: 90.2 fl (ref 78.0–100.0)
Platelets: 258 10*3/uL (ref 150.0–400.0)
RBC: 4.35 Mil/uL (ref 3.87–5.11)
RDW: 12.7 % (ref 11.5–15.5)
WBC: 4.7 10*3/uL (ref 4.0–10.5)

## 2021-12-09 LAB — IBC + FERRITIN
Ferritin: 36.9 ng/mL (ref 10.0–291.0)
Iron: 61 ug/dL (ref 42–145)
Saturation Ratios: 18.2 % — ABNORMAL LOW (ref 20.0–50.0)
TIBC: 334.6 ug/dL (ref 250.0–450.0)
Transferrin: 239 mg/dL (ref 212.0–360.0)

## 2021-12-09 MED ORDER — KETOCONAZOLE 2 % EX SHAM: 1.0000 | MEDICATED_SHAMPOO | CUTANEOUS | 0 refills | Status: AC

## 2021-12-09 NOTE — Assessment & Plan Note (Signed)
Referral placed for pulmonary

## 2021-12-09 NOTE — Patient Instructions (Addendum)
  Recommend starting biotin once daily over the counter.   A referral was placed today for pulmonary  Please let us know if you have not heard back within 2 weeks about the referral.  Stop by the lab prior to leaving today. I will notify you of your results once received.   Welcome to our clinic, I am happy to have you as my new patient. I am excited to continue on this healthcare journey with you.  Stop by the lab prior to leaving today. I will notify you of your results once received.   Please keep in mind Any my chart messages you send have up to a three business day turnaround for a response.  Phone calls may take up to a one full business day turnaround for a  response.   If you need a medication refill I recommend you request it through the pharmacy as this is easiest for [place] rather than sending a message and or phone call.   Due to recent changes in healthcare laws, you may see results of your imaging and/or laboratory studies on MyChart before I have had a chance to review them.  I understand that in some cases there may be results that are confusing or concerning to you. Please understand that not all results are received at the same time and often I may need to interpret multiple results in order to provide you with the best plan of care or course of treatment. Therefore, I ask that you please give me 2 business days to thoroughly review all your results before contacting my office for clarification. Should we see a critical lab result, you will be contacted sooner.   It was a pleasure seeing you today! Please do not hesitate to reach out with any questions and or concerns.  Regards,   Eugenia Pancoast FNP-C

## 2021-12-09 NOTE — Assessment & Plan Note (Signed)
Stable Continue f/u with cardiologist as scheduled.

## 2021-12-09 NOTE — Assessment & Plan Note (Signed)
Failed fosamax. Pt hesitant for prolia. Appt pending with endo in April 2024

## 2021-12-09 NOTE — Assessment & Plan Note (Signed)
Ordering cbc ibc ferritin total iron pending results

## 2021-12-09 NOTE — Progress Notes (Signed)
Established Patient Office Visit  Subjective:  Patient ID: Kimberly Cross, female    DOB: 12-Jun-1955  Age: 66 y.o. MRN: [id number]  CC:  Chief Complaint  Patient presents with   Establish Care    Transfer of Care    HPI Kimberly Cross is here for a transition of care visit.  Prior provider was: Pt is without acute concerns.   Pap: 03/17/21 , negative >65 y/o   chronic concerns:  Hld:  Lab Results  Component Value Date   CHOL 215 (H) 09/09/2021   HDL 65.40 09/09/2021   LDLCALC 125 (H) 09/09/2021   TRIG 126.0 09/09/2021   CHOLHDL 3 09/09/2021   Thinning of hair , more so recently in the last few years. Coming out in her brush and in the shower and also brushing her teeth, finds it often going away. Has not yet tried biotin or anything. Denies itchy scalp. Used to always have thick fine hair but very thin as of lately which has been bothering her.   Does also have a dermatologist.   Snoring: has been snoring for years and lately starting to wake her up at night.   Osteoporosis: referred to endo , has appt in April. Failed fosamax due to severe rflux hesitant for prolia hence referral to endo.   Past Medical History:  Diagnosis Date   Immunization, BCG    Osteoporosis    PVC (premature ventricular contraction)    UTI (urinary tract infection)     Past Surgical History:  Procedure Laterality Date   APPENDECTOMY     BREAST BIOPSY Left    over 20 years ago- Benign   BREAST SURGERY Left    benign breast biopsy   THUMB ARTHROSCOPY Bilateral    VARICOSE VEIN SURGERY     WISDOM TOOTH EXTRACTION      Family History  Problem Relation Age of Onset   Hyperlipidemia Mother    Alzheimer's disease Mother    Heart attack Father 42   Diabetes Father    Heart disease Father    Diabetes Brother    Diabetes Brother    Bladder Cancer Brother        in remission   Heart failure Brother        NICM   Heart attack Brother 4   Kidney failure Maternal Grandmother        following  hysterectomy   Heart attack Maternal Grandfather 80   Stroke Paternal Grandmother 63   Lung cancer Paternal Grandfather        smoker   Heart attack Paternal Uncle 95   Breast cancer Neg Hx     Social History   Socioeconomic History   Marital status: Married    Spouse name: Louie Casa    Number of children: 2   Years of education: nursing degree   Highest education level: Not on file  Occupational History    Employer: [employer]  Tobacco Use   Smoking status: Never   Smokeless tobacco: Never  Vaping Use   Vaping Use: Never used  Substance and Sexual Activity   Alcohol use: Not Currently    Comment: 1-2 drinks per year   Drug use: Never   Sexual activity: Yes    Partners: Male    Birth control/protection: Post-menopausal  Other Topics Concern   Not on file  Social History Narrative   04/29/19   From: [place] and recently moved from [place]: with husband, Louie Casa   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      Family: Laporte [phone]) and Dell ([place])        Enjoys: garden, kayak, hike      Exercise: exercises 3 times a week for 30 minutes as part of a study, yoga stretches daily   Diet: good, limits sweets, veggies/meat - chicken/fish, limits pasta      Safety   Seat belts: Yes    Guns: No   Safe in relationships: Yes    Social Determinants of Radio broadcast assistant Strain: Not on file  Food Insecurity: Not on file  Transportation Needs: Not on file  Physical Activity: Not on file  Stress: Not on file  Social Connections: Not on file  Intimate Partner Violence: Not on file    Outpatient Medications Prior to Visit  Medication Sig Dispense Refill   Calcium Citrate-Vitamin D (CALCIUM + D) 315-5 MG-MCG TABS Take 1,300 mg by mouth daily.     Multiple Vitamin (MULTI-VITAMIN DAILY PO) Take 1 tablet by mouth daily.     Turmeric 500 MG CAPS Take 250 mg by mouth daily.     No facility-administered medications prior to  visit.    No Known Allergies  ROS Review of Systems  Review of Systems  Respiratory:  Negative for shortness of breath.  Snoring at night that is waking her up Cardiovascular:  Negative for chest pain and palpitations.  Gastrointestinal:  Negative for constipation and diarrhea.  Genitourinary:  Negative for dysuria, frequency and urgency.  Musculoskeletal:  Negative for myalgias.  Skin: thinning of hair  Psychiatric/Behavioral:  Negative for depression and suicidal ideas.   All other systems reviewed and are negative.    Objective:    Physical Exam Constitutional:      General: She is not in acute distress.    Appearance: Normal appearance. She is normal weight. She is not ill-appearing, toxic-appearing or diaphoretic.  Skin:    General: Skin is warm.     Comments: Hair with scalp irritation with redness and also dermatitis. Slight dandruff in scalp as well. Small papular lesions mainly on right side of scalp   Neurological:     Mental Status: She is alert.       BP 134/78   Pulse 79   Temp (!) 97.5 F (36.4 C)   Ht _0  (1.575 m)   Wt 134 lb 3.2 oz (60.9 kg)   SpO2 99%   BMI 24.55 kg/m  Wt Readings from Last 3 Encounters:  12/09/21 134 lb 3.2 oz (60.9 kg)  09/09/21 131 lb (59.4 kg)  03/17/21 133 lb 6 oz (60.5 kg)     Health Maintenance Due  Topic Date Due   Medicare Annual Wellness (AWV)  Never done   Pneumonia Vaccine 53+ Years old (1 - PCV) Never done    There are no preventive care reminders to display for this patient.  Lab Results  Component Value Date   TSH 1.07 09/09/2021   Lab Results  Component Value Date   WBC 4.8 09/09/2021   HGB 13.5 09/09/2021   HCT 40.6 09/09/2021   MCV 90.6 09/09/2021   PLT 219.0 09/09/2021   Lab Results  Component Value Date   NA 139 09/09/2021   K 4.8 09/09/2021   CO2 29 09/09/2021   GLUCOSE 90 09/09/2021   BUN 14 09/09/2021   CREATININE 0.66 09/09/2021   BILITOT 0.4 09/09/2021   ALKPHOS 76 09/09/2021    AST 17 09/09/2021  ALT 13 09/09/2021   PROT 6.5 09/09/2021   ALBUMIN 4.0 09/09/2021   CALCIUM 9.4 09/09/2021   ANIONGAP 9 10/23/2020   EGFR 104 10/21/2020   GFR 92.38 09/09/2021   Lab Results  Component Value Date   CHOL 215 (H) 09/09/2021   Lab Results  Component Value Date   HDL 65.40 09/09/2021   Lab Results  Component Value Date   LDLCALC 125 (H) 09/09/2021   Lab Results  Component Value Date   TRIG 126.0 09/09/2021   Lab Results  Component Value Date   CHOLHDL 3 09/09/2021   No results found for: "HGBA1C"    Assessment & Plan:   Problem List Items Addressed This Visit       Cardiovascular and Mediastinum   PVC's (premature ventricular contractions) - Primary    Stable Continue f/u with cardiologist as scheduled.      Relevant Orders   CBC     Musculoskeletal and Integument   Osteoporosis    Failed fosamax. Pt hesitant for prolia. Appt pending with endo in April 2024      Alopecia    Ordering cbc ibc ferritin total iron pending results       Relevant Orders   IBC + Ferritin   Contact dermatitis of scalp    Rx trial ketoconazole 2% use twice weekly for 2-4 weeks . Advised pt of directions to utilize.  Recommended head and shoulders shampoo over the counter.  Pt to see derm as well as already established.       Relevant Medications   ketoconazole (NIZORAL) 2 % shampoo     Other   Snoring    Referral placed for pulmonary       Relevant Orders   Ambulatory referral to Pulmonology   Hair loss   RESOLVED: Encounter for vaccination    Meds ordered this encounter  Medications   ketoconazole (NIZORAL) 2 % shampoo    Sig: Apply 1 Application topically 2 (two) times a week. Apply to hair and lather leave in for 3 to 5 minutes then rinse out    Dispense:  120 mL    Refill:  0    Order Specific Question:   Supervising Provider    Answer:   Diona Browner, AMY E [[id number]]    Follow-up: Return in about 1 year (around 12/10/2022) for f/u alopecia,  f/u CPE.    Eugenia Pancoast, FNP

## 2021-12-09 NOTE — Assessment & Plan Note (Signed)
Rx trial ketoconazole 2% use twice weekly for 2-4 weeks . Advised pt of directions to utilize.  Recommended head and shoulders shampoo over the counter.  Pt to see derm as well as already established.

## 2021-12-17 DIAGNOSIS — D2271 Melanocytic nevi of right lower limb, including hip: Secondary | ICD-10-CM | POA: Diagnosis not present

## 2021-12-17 DIAGNOSIS — L814 Other melanin hyperpigmentation: Secondary | ICD-10-CM | POA: Diagnosis not present

## 2021-12-17 DIAGNOSIS — D2272 Melanocytic nevi of left lower limb, including hip: Secondary | ICD-10-CM | POA: Diagnosis not present

## 2021-12-17 DIAGNOSIS — D225 Melanocytic nevi of trunk: Secondary | ICD-10-CM | POA: Diagnosis not present

## 2021-12-17 DIAGNOSIS — L821 Other seborrheic keratosis: Secondary | ICD-10-CM | POA: Diagnosis not present

## 2021-12-17 DIAGNOSIS — D2261 Melanocytic nevi of right upper limb, including shoulder: Secondary | ICD-10-CM | POA: Diagnosis not present

## 2021-12-17 DIAGNOSIS — D2262 Melanocytic nevi of left upper limb, including shoulder: Secondary | ICD-10-CM | POA: Diagnosis not present

## 2021-12-24 DIAGNOSIS — R030 Elevated blood-pressure reading, without diagnosis of hypertension: Secondary | ICD-10-CM | POA: Diagnosis not present

## 2021-12-24 DIAGNOSIS — Z008 Encounter for other general examination: Secondary | ICD-10-CM | POA: Diagnosis not present

## 2021-12-24 DIAGNOSIS — I739 Peripheral vascular disease, unspecified: Secondary | ICD-10-CM | POA: Diagnosis not present

## 2021-12-24 DIAGNOSIS — Z833 Family history of diabetes mellitus: Secondary | ICD-10-CM | POA: Diagnosis not present

## 2021-12-24 DIAGNOSIS — Z8249 Family history of ischemic heart disease and other diseases of the circulatory system: Secondary | ICD-10-CM | POA: Diagnosis not present

## 2022-01-07 ENCOUNTER — Ambulatory Visit
Admission: RE | Admit: 2022-01-07 | Discharge: 2022-01-07 | Disposition: A | Discharge status: Home / Self Care | Payer: Medicare HMO | Source: Ambulatory Visit | Attending: Family Medicine | Admitting: Family Medicine

## 2022-01-07 DIAGNOSIS — Z1231 Encounter for screening mammogram for malignant neoplasm of breast: Secondary | ICD-10-CM

## 2022-01-07 NOTE — Progress Notes (Signed)
No critical labs need to be addressed urgently. We will discuss labs in detail at upcoming office visit.   

## 2022-01-10 ENCOUNTER — Institutional Professional Consult (permissible substitution) (HOSPITAL_BASED_OUTPATIENT_CLINIC_OR_DEPARTMENT_OTHER): Payer: Medicare HMO | Admitting: Pulmonary Disease

## 2022-01-13 ENCOUNTER — Encounter: Payer: Self-pay | Admitting: Family

## 2022-01-13 DIAGNOSIS — I739 Peripheral vascular disease, unspecified: Secondary | ICD-10-CM | POA: Insufficient documentation

## 2022-01-18 ENCOUNTER — Ambulatory Visit (INDEPENDENT_AMBULATORY_CARE_PROVIDER_SITE_OTHER): Payer: Medicare HMO | Admitting: Adult Health

## 2022-01-18 ENCOUNTER — Encounter: Payer: Self-pay | Admitting: Adult Health

## 2022-01-18 VITALS — BP 120/80 | HR 73 | Temp 97.6°F | Ht 62.0 in | Wt 135.0 lb

## 2022-01-18 DIAGNOSIS — R0683 Snoring: Secondary | ICD-10-CM

## 2022-01-18 NOTE — Patient Instructions (Signed)
Set up for home sleep study  Healthy sleep regimen.  Do not drive if sleepy  Follow up in 3 months to discuss sleep study results and treatment plan

## 2022-01-18 NOTE — Assessment & Plan Note (Signed)
Loud snoring, daytime sleep and fatigue all concerning for underlying sleep apnea.  Will set patient up for home sleep study.  Patient education was given - discussed how weight can impact sleep and risk for sleep disordered breathing - discussed options to assist with weight loss: combination of diet modification, cardiovascular and strength training exercises   - had an extensive discussion regarding the adverse health consequences related to untreated sleep disordered breathing - specifically discussed the risks for hypertension, coronary artery disease, cardiac dysrhythmias, cerebrovascular disease, and diabetes - lifestyle modification discussed   - discussed how sleep disruption can increase risk of accidents, particularly when driving - safe driving practices were discussed   Plan  Patient Instructions  Set up for home sleep study  Healthy sleep regimen.  Do not drive if sleepy  Follow up in 3 months to discuss sleep study results and treatment plan

## 2022-01-18 NOTE — Progress Notes (Signed)
$'@Patient's$  ID: Kimberly Cross, female    DOB: 07/17/1955, 67 y.o.   MRN: [id number]  Chief Complaint  Patient presents with   Consult    Referring provider: Eugenia Pancoast, FNP  HPI: 67 year old female seen for sleep consult January 18, 2022 for snoring, daytime sleepiness and fatigue.  TEST/EVENTS :   01/18/2022 Sleep consult  Patient presents for sleep consult today.  Kindly referred by primary care provider Georgina Peer, FNP patient complains over the last 2 years that she has had increased snoring that is interfering with her husband sleep.  He complains that she snores very loudly.  She also has associated daytime sleepiness and fatigue.  Says she really never noticed this until the last couple years.  She typically goes to bed about 9 PM.  Only takes a couple minutes to go to sleep.  Is up 2-3 times at night.  Gets up at 6:30 AM.  Weight is up about 5 pounds over the last 2 years current weight is at 135 pounds with a BMI at 24.  Has had no previous sleep study.  Does not take any sleep aids.  Drinks 2 cups of coffee each morning.  No history of congestive heart failure or stroke.  Does not take any pain medicines.  Is not on oxygen.  Epworth score is 5 typically gets sleepy as she watches TV or in the afternoon hours.  No symptoms suspicious for cataplexy or sleep paralysis.  No removable dental work.   Past medical history is hyperlipidemia, osteoporosis and diastolic dysfunction on 2D echo.  Surgical history appendectomy in 1975, bilateral thumb surgery  Social history patient is married.  Works part-time as a IT consultant.  Has adult children.  She is a never smoker.  No alcohol or drug use.  Patient is from [place].  Family history is positive for heart disease and dementia   No Known Allergies  Immunization History  Administered Date(s) Administered   PFIZER(Purple Top)SARS-COV-2 Vaccination 03/14/2019, 04/11/2019, 12/06/2019, 06/19/2020   PNEUMOCOCCAL CONJUGATE-20  12/09/2021   Tdap 11/30/2010, 08/06/2018   Zoster Recombinat (Shingrix) 09/02/2019, 11/13/2019    Past Medical History:  Diagnosis Date   Immunization, BCG    Osteoporosis    PVC (premature ventricular contraction)    UTI (urinary tract infection)     Tobacco History: Social History   Tobacco Use  Smoking Status Never  Smokeless Tobacco Never   Counseling given: Not Answered   Outpatient Medications Prior to Visit  Medication Sig Dispense Refill   Calcium Citrate-Vitamin D (CALCIUM + D) 315-5 MG-MCG TABS Take 1,300 mg by mouth daily.     ferrous sulfate 324 MG TBEC Take 324 mg by mouth daily with breakfast.     Multiple Vitamin (MULTI-VITAMIN DAILY PO) Take 1 tablet by mouth daily.     Turmeric 500 MG CAPS Take 250 mg by mouth daily.     ketoconazole (NIZORAL) 2 % shampoo Apply 1 Application topically 2 (two) times a week. Apply to hair and lather leave in for 3 to 5 minutes then rinse out (Patient not taking: Reported on 01/18/2022) 120 mL 0   No facility-administered medications prior to visit.     Review of Systems:   Constitutional:   No  weight loss, night sweats,  Fevers, chills,  +fatigue, or  lassitude.  HEENT:   No headaches,  Difficulty swallowing,  Tooth/dental problems, or  Sore throat,  No sneezing, itching, ear ache, nasal congestion, post nasal drip,   CV:  No chest pain,  Orthopnea, PND, swelling in lower extremities, anasarca, dizziness, palpitations, syncope.   GI  No heartburn, indigestion, abdominal pain, nausea, vomiting, diarrhea, change in bowel habits, loss of appetite, bloody stools.   Resp: No shortness of breath with exertion or at rest.  No excess mucus, no productive cough,  No non-productive cough,  No coughing up of blood.  No change in color of mucus.  No wheezing.  No chest wall deformity  Skin: no rash or lesions.  GU: no dysuria, change in color of urine, no urgency or frequency.  No flank pain, no hematuria   MS:   No joint pain or swelling.  No decreased range of motion.  No back pain.    Physical Exam  BP 120/80 (BP Location: Left Arm, Patient Position: Sitting, Cuff Size: Normal)   Pulse 73   Temp 97.6 F (36.4 C) (Oral)   Ht '5\' 2"'$  (1.575 m)   Wt 135 lb (61.2 kg)   SpO2 99%   BMI 24.69 kg/m   GEN: A/Ox3; pleasant , NAD, well nourished    HEENT:  [place]/AT,   NOSE-clear, THROAT-clear, no lesions, no postnasal drip or exudate noted.  Class II-III MP airway  NECK:  Supple w/ fair ROM; no JVD; normal carotid impulses w/o bruits; no thyromegaly or nodules palpated; no lymphadenopathy.    RESP  Clear  P & A; w/o, wheezes/ rales/ or rhonchi. no accessory muscle use, no dullness to percussion  CARD:  RRR, no m/r/g, no peripheral edema, pulses intact, no cyanosis or clubbing.  GI:   Soft & nt; nml bowel sounds; no organomegaly or masses detected.   Musco: Warm bil, no deformities or joint swelling noted.   Neuro: alert, no focal deficits noted.    Skin: Warm, no lesions or rashes    Lab Results:  CBC    Component Value Date/Time   WBC 4.7 12/09/2021 0929   RBC 4.35 12/09/2021 0929   HGB 13.5 12/09/2021 0929   HCT 39.2 12/09/2021 0929   PLT 258.0 12/09/2021 0929   MCV 90.2 12/09/2021 0929   MCHC 34.4 12/09/2021 0929   RDW 12.7 12/09/2021 0929   LYMPHSABS 1.7 07/31/2018 0804   MONOABS 0.3 07/31/2018 0804   EOSABS 0.0 07/31/2018 0804   BASOSABS 0.0 07/31/2018 0804    BMET    Component Value Date/Time   NA 139 09/09/2021 0912   NA 142 10/21/2020 1030   K 4.8 09/09/2021 0912   CL 103 09/09/2021 0912   CO2 29 09/09/2021 0912   GLUCOSE 90 09/09/2021 0912   BUN 14 09/09/2021 0912   BUN 12 10/21/2020 1030   CREATININE 0.66 09/09/2021 0912   CALCIUM 9.4 09/09/2021 0912   GFRNONAA >60 10/23/2020 1323    BNP No results found for: "BNP"  ProBNP No results found for: "PROBNP"  Imaging: MM 3D SCREEN BREAST BILATERAL  Result Date: 01/07/2022 CLINICAL DATA:  Screening. EXAM:  DIGITAL SCREENING BILATERAL MAMMOGRAM WITH TOMOSYNTHESIS AND CAD TECHNIQUE: Bilateral screening digital craniocaudal and mediolateral oblique mammograms were obtained. Bilateral screening digital breast tomosynthesis was performed. The images were evaluated with computer-aided detection. COMPARISON:  Previous exam(s). ACR Breast Density Category b: There are scattered areas of fibroglandular density. FINDINGS: There are no findings suspicious for malignancy. IMPRESSION: No mammographic evidence of malignancy. A result letter of this screening mammogram will be mailed directly to the patient. RECOMMENDATION: Screening mammogram in  one year. (Code:SM-B-01Y) BI-RADS CATEGORY  1: Negative. Electronically Signed   By: Margarette Canada M.D.   On: 01/07/2022 16:35          No data to display          No results found for: "NITRICOXIDE"      Assessment & Plan:   Snoring Loud snoring, daytime sleep and fatigue all concerning for underlying sleep apnea.  Will set patient up for home sleep study.  Patient education was given - discussed how weight can impact sleep and risk for sleep disordered breathing - discussed options to assist with weight loss: combination of diet modification, cardiovascular and strength training exercises   - had an extensive discussion regarding the adverse health consequences related to untreated sleep disordered breathing - specifically discussed the risks for hypertension, coronary artery disease, cardiac dysrhythmias, cerebrovascular disease, and diabetes - lifestyle modification discussed   - discussed how sleep disruption can increase risk of accidents, particularly when driving - safe driving practices were discussed   Plan  Patient Instructions  Set up for home sleep study  Healthy sleep regimen.  Do not drive if sleepy  Follow up in 3 months to discuss sleep study results and treatment plan        Rexene Edison, NP 01/18/2022

## 2022-01-19 NOTE — Progress Notes (Signed)
Reviewed and agree with assessment/plan.   Chesley Mires, MD [facility] Pulmonary/Critical Care 01/19/2022, 10:37 AM Pager:  [phone]

## 2022-01-21 DIAGNOSIS — R69 Illness, unspecified: Secondary | ICD-10-CM | POA: Diagnosis not present

## 2022-02-10 IMAGING — CT CT HEART MORP W/ CTA COR W/ SCORE W/ CA W/CM &/OR W/O CM
2 of 11 series · 7 of 20 positions shown, 9 images · non-contrast
Comparison: None.

Addendum:
CLINICAL DATA: Chest pain

EXAM:
Cardiac/Coronary  CTA
TECHNIQUE: The patient was scanned on a Siemens Somatom go.Top scanner.

[Series 25: multiphase % cta coronary 0.60 · axial · 0.31mm/px · z∈[-1118,-1040]mm · 5 of 3245 slices shown, 7 images]
[im 541/3245  vessel]
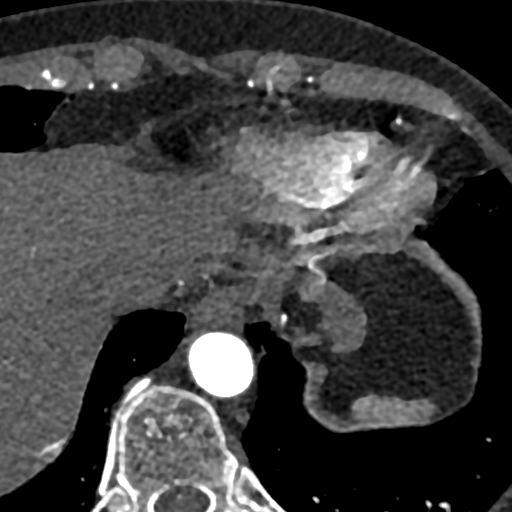
[im 541/3245  lung]
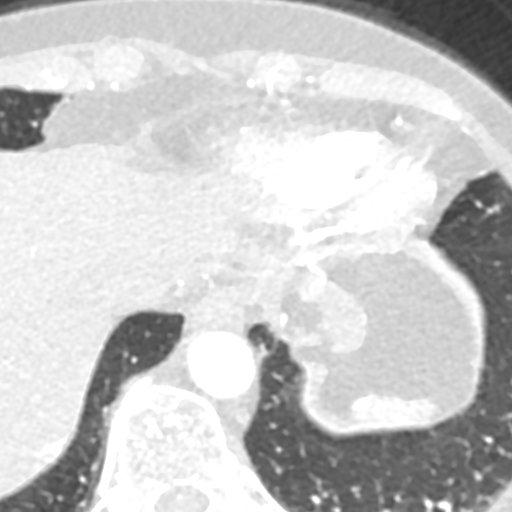
[im 1082/3245  vessel]
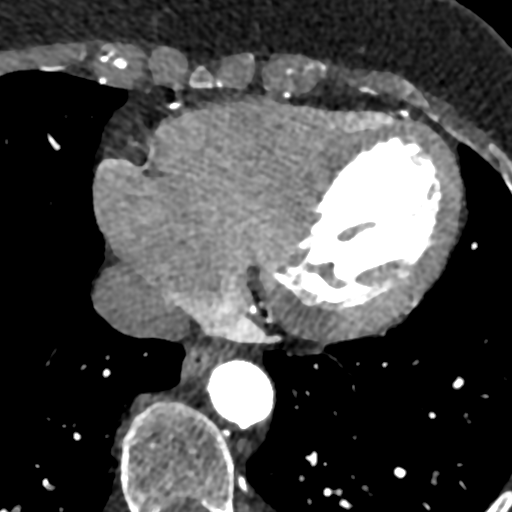
[im 1623/3245  vessel]
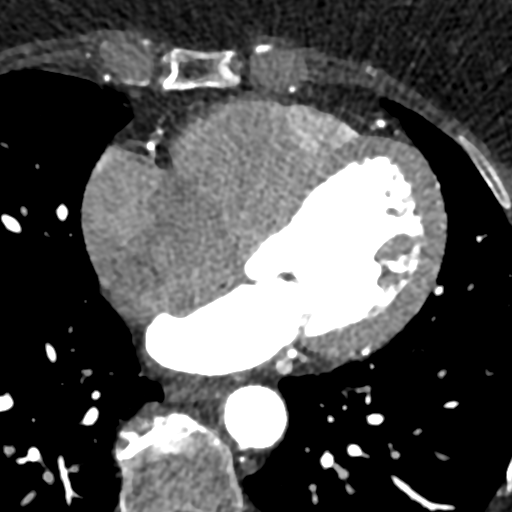
[im 2163/3245  vessel]
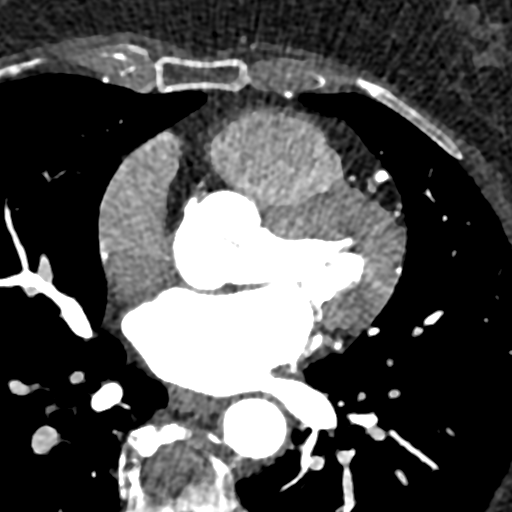
[im 2704/3245  vessel]
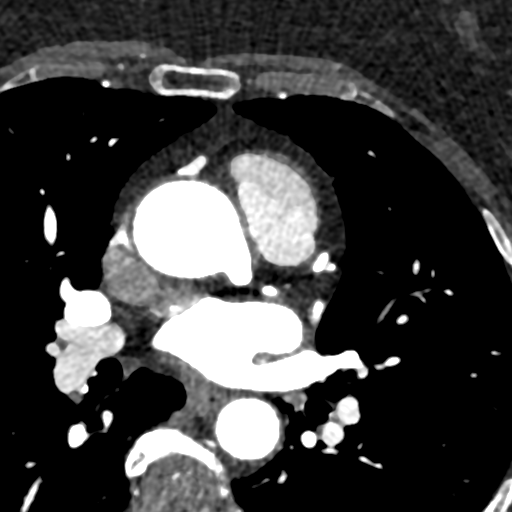
[im 2704/3245  lung]
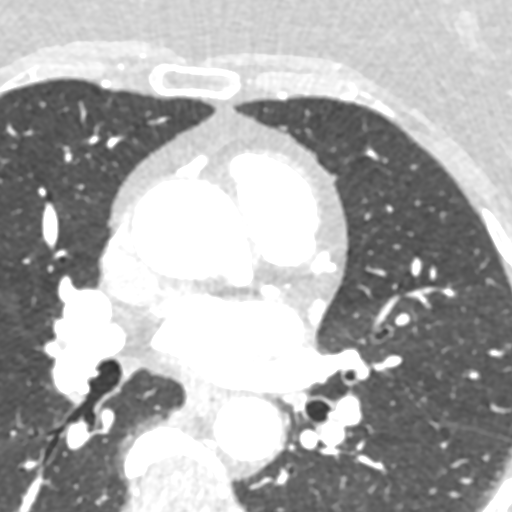

[Series 38: ms multiphase cta coronary 0.60 · axial · 0.31mm/px · z∈[-1099,-1059]mm · 2 of 2360 slices shown]
[im 787/2360  vessel]
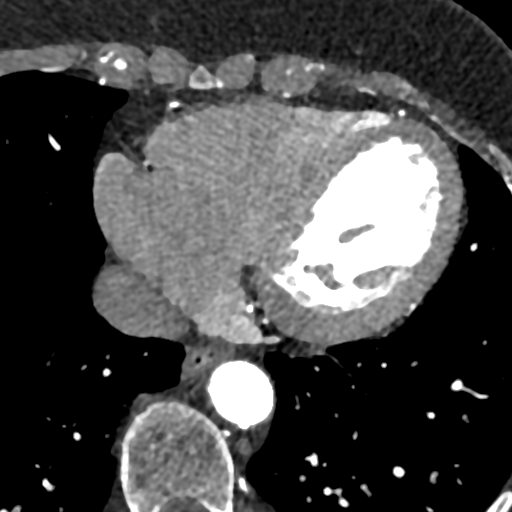
[im 1573/2360  vessel]
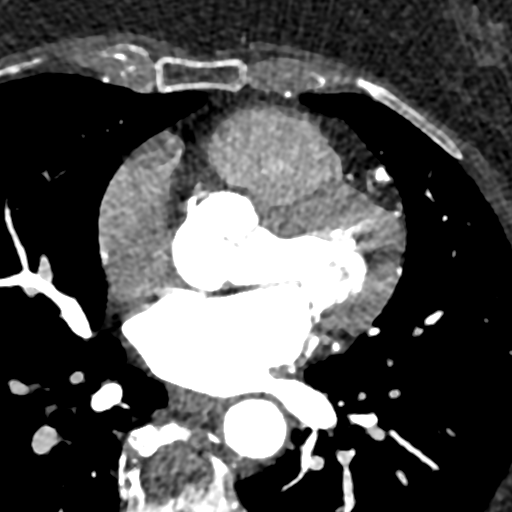

[7 of 20 positions shown; findings below may reference images not displayed]



Aortic Valve:  Trileaflet.  No calcifications.

Coronary Arteries:  Normal coronary origin.  Left dominance.

RCA is a non dominant artery.  There is no plaque.

Left main is a large artery that gives rise to LAD and LCX arteries.

LAD has no plaque.

LCX is a dominant artery that gives rise to two obtuse marginal
branches before branching into the PDA. There is no plaque.

Other findings:

Normal pulmonary vein drainage into the left atrium.

Normal left atrial appendage without a thrombus.

Normal size of the pulmonary artery.
IMPRESSION: 1. Normal coronary calcium score of 0. Patient is low risk for
coronary events.

2. Normal coronary origin with left dominance.

3. No evidence of CAD.

4. CAD-RADS 0. Consider non-atherosclerotic causes of chest pain.

EXAM:
OVER-READ INTERPRETATION  CT CHEST

The following report is an over-read performed by radiologist Dr.
does not include interpretation of cardiac or coronary anatomy or
pathology. The coronary calcium score/coronary CTA interpretation by
the cardiologist is attached.
FINDINGS: No suspicious nodules, masses, or infiltrates are identified in the
visualized portion of the lungs. No pleural fluid seen.

The visualized portions of the mediastinum and chest wall are
unremarkable.
IMPRESSION: No significant non-cardiac abnormality in visualized portion of the
thorax.



Aortic Valve:  Trileaflet.  No calcifications.

Coronary Arteries:  Normal coronary origin.  Left dominance.

RCA is a non dominant artery.  There is no plaque.

Left main is a large artery that gives rise to LAD and LCX arteries.

LAD has no plaque.

LCX is a dominant artery that gives rise to two obtuse marginal
branches before branching into the PDA. There is no plaque.

Other findings:

Normal pulmonary vein drainage into the left atrium.

Normal left atrial appendage without a thrombus.

Normal size of the pulmonary artery.
IMPRESSION: 1. Normal coronary calcium score of 0. Patient is low risk for
coronary events.

2. Normal coronary origin with left dominance.

3. No evidence of CAD.

4. CAD-RADS 0. Consider non-atherosclerotic causes of chest pain.

## 2022-03-05 DIAGNOSIS — R69 Illness, unspecified: Secondary | ICD-10-CM | POA: Diagnosis not present

## 2022-04-01 ENCOUNTER — Ambulatory Visit
Admission: RE | Admit: 2022-04-01 | Discharge: 2022-04-01 | Disposition: A | Discharge status: Home / Self Care | Payer: Medicare HMO | Source: Ambulatory Visit | Attending: Family Medicine | Admitting: Family Medicine

## 2022-04-01 DIAGNOSIS — M810 Age-related osteoporosis without current pathological fracture: Secondary | ICD-10-CM | POA: Diagnosis not present

## 2022-04-01 DIAGNOSIS — Z780 Asymptomatic menopausal state: Secondary | ICD-10-CM | POA: Diagnosis not present

## 2022-04-04 DIAGNOSIS — G4730 Sleep apnea, unspecified: Secondary | ICD-10-CM | POA: Diagnosis not present

## 2022-04-20 ENCOUNTER — Telehealth: Payer: Self-pay | Admitting: Pulmonary Disease

## 2022-04-20 ENCOUNTER — Ambulatory Visit (INDEPENDENT_AMBULATORY_CARE_PROVIDER_SITE_OTHER): Payer: Medicare HMO

## 2022-04-20 DIAGNOSIS — G4733 Obstructive sleep apnea (adult) (pediatric): Secondary | ICD-10-CM | POA: Diagnosis not present

## 2022-04-20 DIAGNOSIS — R0683 Snoring: Secondary | ICD-10-CM

## 2022-04-20 NOTE — Telephone Encounter (Signed)
Call patient  Sleep study result  Date of study: 04/04/2022  Impression: Mild obstructive sleep apnea, mild oxygen desaturations  Recommendation: Options of treatment for mild obstructive sleep apnea will include  1.  CPAP therapy if there is significant daytime sleepiness or other comorbidities including history of CVA or cardiac disease  -If CPAP is chosen as an option of treatment auto titrating CPAP with a pressure setting of 5-15 will be appropriate  2.  Watchful waiting with emphasis on weight loss measures, sleep position modification to optimize lateral sleep, elevating the head of the bed by about 30 degrees may also help.  3.  An oral device may be fashioned for the treatment of mild sleep disordered breathing, will involve referral to dentist.  Follow-up as previously scheduled

## 2022-04-21 NOTE — Telephone Encounter (Signed)
Sleep study shows mild obstructive sleep apnea please set up office visit or virtual visit to discuss sleep study results and treatment plan.  Can be in person or virtual.  Can double book in a day over the next week to review

## 2022-04-21 NOTE — Telephone Encounter (Signed)
Called and spoke to patient and got her set up to do a virtual visit with TP next Thursday to go over sleep study results. Nothing further needed

## 2022-04-26 ENCOUNTER — Other Ambulatory Visit: Payer: Self-pay

## 2022-04-28 ENCOUNTER — Telehealth: Payer: Medicare HMO | Admitting: Adult Health

## 2022-05-08 DIAGNOSIS — R69 Illness, unspecified: Secondary | ICD-10-CM | POA: Diagnosis not present

## 2022-05-19 ENCOUNTER — Encounter: Payer: Self-pay | Admitting: Adult Health

## 2022-05-19 ENCOUNTER — Telehealth (INDEPENDENT_AMBULATORY_CARE_PROVIDER_SITE_OTHER): Payer: Medicare HMO | Admitting: Adult Health

## 2022-05-19 DIAGNOSIS — G4733 Obstructive sleep apnea (adult) (pediatric): Secondary | ICD-10-CM | POA: Diagnosis not present

## 2022-05-19 DIAGNOSIS — R0683 Snoring: Secondary | ICD-10-CM

## 2022-05-19 NOTE — Progress Notes (Signed)
Virtual Visit via Video Note  I connected with Kimberly Cross on 05/19/22 at  8:30 AM EDT by a video enabled telemedicine application and verified that I am speaking with the correct person using two identifiers.  Location: Patient: Home  Provider: Office    I discussed the limitations of evaluation and management by telemedicine and the availability of in person appointments. The patient expressed understanding and agreed to proceed.  History of Present Illness: 67 year old female seen for sleep consult January 18, 2022 for snoring and daytime sleepiness found to have mild obstructive sleep apnea Today's virtual visit is to discuss sleep study results.  Patient was seen in January for sleep consult for snoring and daytime sleepiness.  She was set up for home sleep study that was completed April 04, 2022 that showed mild obstructive sleep apnea with AHI at 9.4/hour and SpO2 low at 84%.  We discussed her sleep study results in detail.  We went over treatment options including positional sleep, oral appliance, CPAP therapy.  Patient would like to proceed with CPAP therapy.  Patient has tried compliance over-the-counter and it caused jaw soreness.  Patient education on sleep apnea and CPAP care.  Husband -Harvie Heck has sleep apnea is on CPAP at bedtime. He is our patient .   Past Medical History:  Diagnosis Date   Immunization, BCG    Osteoporosis    PVC (premature ventricular contraction)    UTI (urinary tract infection)     Current Outpatient Medications on File Prior to Visit  Medication Sig Dispense Refill   Calcium Citrate-Vitamin D (CALCIUM + D) 315-5 MG-MCG TABS Take 1,300 mg by mouth daily.     ferrous sulfate 324 MG TBEC Take 324 mg by mouth daily with breakfast.     ketoconazole (NIZORAL) 2 % shampoo Apply 1 Application topically 2 (two) times a week. Apply to hair and lather leave in for 3 to 5 minutes then rinse out 120 mL 0   Multiple Vitamin (MULTI-VITAMIN DAILY PO) Take 1 tablet by mouth  daily.     Omega-3 Fatty Acids (FISH OIL) 1000 MG CAPS Take 1 capsule by mouth in the morning.     Turmeric 500 MG CAPS Take 250 mg by mouth daily.     No current facility-administered medications on file prior to visit.    '    Observations/Objective: Home sleep study April 04, 2022 showed mild sleep apnea with AHI at 9.4/hour and SpO2 low at 84%.  Appears well in no acute distress, husband is with her today for today's visit  Assessment and Plan: Mild obstructive sleep apnea.  Patient education given on sleep apnea.  We reviewed her home sleep study results in detail.  We went over treatment options.  Patient like proceed with CPAP therapy will begin auto CPAP 5 to 15 cm H2O.  Will use a DreamWear nasal mask.  Patient says she wants to use the smallest masses possible this is also the mask that her husband uses and is very successful for him  Plan  Patient Instructions  Begin CPAP At bedtime  , wear all night long for at least 6hr or more  Do not drive if sleepy  Healthy sleep regimen  Try Dream wear nasal mask  Saline nasal gel At bedtime   Follow up in 3 months and As needed      Follow Up Instructions: Follow-up in 3 months and as needed   I discussed the assessment and treatment plan with the patient. The  patient was provided an opportunity to ask questions and all were answered. The patient agreed with the plan and demonstrated an understanding of the instructions.   The patient was advised to call back or seek an in-person evaluation if the symptoms worsen or if the condition fails to improve as anticipated.  I provided 22 minutes of non-face-to-face time during this encounter.   Rubye Oaks, NP

## 2022-05-19 NOTE — Progress Notes (Signed)
Reviewed and agree with assessment/plan.   Aayana Reinertsen, MD [facility] 05/19/2022, 3:17 PM Pager:  [phone]  

## 2022-05-19 NOTE — Addendum Note (Signed)
Addended by: Delrae Rend on: 05/19/2022 04:30 PM   Modules accepted: Orders

## 2022-05-19 NOTE — Patient Instructions (Signed)
Begin CPAP At bedtime  , wear all night long for at least 6hr or more  Do not drive if sleepy  Healthy sleep regimen  Try Dream wear nasal mask  Saline nasal gel At bedtime   Follow up in 3 months and As needed

## 2022-05-25 DIAGNOSIS — M9901 Segmental and somatic dysfunction of cervical region: Secondary | ICD-10-CM | POA: Diagnosis not present

## 2022-05-25 DIAGNOSIS — M25511 Pain in right shoulder: Secondary | ICD-10-CM | POA: Diagnosis not present

## 2022-05-25 DIAGNOSIS — M542 Cervicalgia: Secondary | ICD-10-CM | POA: Diagnosis not present

## 2022-05-25 DIAGNOSIS — M5413 Radiculopathy, cervicothoracic region: Secondary | ICD-10-CM | POA: Diagnosis not present

## 2022-05-26 DIAGNOSIS — M25511 Pain in right shoulder: Secondary | ICD-10-CM | POA: Diagnosis not present

## 2022-05-26 DIAGNOSIS — M9901 Segmental and somatic dysfunction of cervical region: Secondary | ICD-10-CM | POA: Diagnosis not present

## 2022-05-26 DIAGNOSIS — M5413 Radiculopathy, cervicothoracic region: Secondary | ICD-10-CM | POA: Diagnosis not present

## 2022-05-26 DIAGNOSIS — M542 Cervicalgia: Secondary | ICD-10-CM | POA: Diagnosis not present

## 2022-05-31 DIAGNOSIS — M5413 Radiculopathy, cervicothoracic region: Secondary | ICD-10-CM | POA: Diagnosis not present

## 2022-05-31 DIAGNOSIS — M542 Cervicalgia: Secondary | ICD-10-CM | POA: Diagnosis not present

## 2022-05-31 DIAGNOSIS — M25511 Pain in right shoulder: Secondary | ICD-10-CM | POA: Diagnosis not present

## 2022-05-31 DIAGNOSIS — M9901 Segmental and somatic dysfunction of cervical region: Secondary | ICD-10-CM | POA: Diagnosis not present

## 2022-06-01 ENCOUNTER — Encounter: Payer: Self-pay | Admitting: Internal Medicine

## 2022-06-01 ENCOUNTER — Telehealth: Payer: Self-pay | Admitting: Internal Medicine

## 2022-06-01 ENCOUNTER — Ambulatory Visit: Payer: Medicare HMO | Admitting: Internal Medicine

## 2022-06-01 VITALS — BP 124/76 | HR 77 | Ht 62.0 in | Wt 134.0 lb

## 2022-06-01 DIAGNOSIS — M810 Age-related osteoporosis without current pathological fracture: Secondary | ICD-10-CM | POA: Diagnosis not present

## 2022-06-01 NOTE — Telephone Encounter (Signed)
Hi Brandy,    Can you please add this pt to the [facility] List ?  Thank you

## 2022-06-01 NOTE — Progress Notes (Unsigned)
Name: Kimberly Cross  MRN/ DOB: [id number], [birth year]    Age/ Sex: 67 y.o., female    PCP: Mort Sawyers, FNP   Reason for Endocrinology Evaluation: Osteoporosis     Date of Initial Endocrinology Evaluation: 06/01/2022     HPI: [name] is a 67 y.o. female with a past medical history of PVC's, PVD, osteoporosis . The patient presented for initial endocrinology clinic visit on 06/01/2022 for consultative assistance with her osteoporosis.   Pt was diagnosed with osteoporosis: She was diagnosed in late 63s , based on our records DXA 2021 with AP spine -3.1   Menarche at age : 18 Menopausal at age : 64 Fracture Hx: no Hx of HRT: no FH of osteoporosis or hip fracture: mother  Prior Hx of anti-resorptive therapy : Fosamax for few months in her 41's but tore her esophagus   Alive Calcium 2 tabs contains  650 mg/50 total  MVI 2 gummies have    /200/ vitD 500 iu  Protein powder : contains 60 mg of calcium daily   Weightbearing exercise: not recently    Denies heartburn  Rare arthralgia  Denies constipation or diarrhea  No person Hx of CVA or CAD No personal cancer  No XRT exposure  Denies recent falls       HISTORY:  Past Medical History:  Past Medical History:  Diagnosis Date   Immunization, BCG    Osteoporosis    PVC (premature ventricular contraction)    UTI (urinary tract infection)    Past Surgical History:  Past Surgical History:  Procedure Laterality Date   APPENDECTOMY     BREAST BIOPSY Left    over 20 years ago- Benign   BREAST SURGERY Left    benign breast biopsy   THUMB ARTHROSCOPY Bilateral    VARICOSE VEIN SURGERY     WISDOM TOOTH EXTRACTION      Social History:  reports that she has never smoked. She has never used smokeless tobacco. She reports that she does not currently use alcohol. She reports that she does not use drugs. Family History: family history includes Alzheimer's disease in her mother; Bladder Cancer in her brother; Diabetes in her  brother, brother, and father; Heart attack (age of onset: 23) in her paternal uncle; Heart attack (age of onset: 12) in her brother; Heart attack (age of onset: 25) in her father; Heart attack (age of onset: 49) in her maternal grandfather; Heart disease in her father; Heart failure in her brother; Hyperlipidemia in her mother; Kidney failure in her maternal grandmother; Lung cancer in her paternal grandfather; Stroke (age of onset: 20) in her paternal grandmother.   HOME MEDICATIONS: Allergies as of 06/01/2022   No Known Allergies      Medication List        Accurate as of Jun 01, 2022  4:07 PM. If you have any questions, ask your nurse or doctor.          Calcium + D 315-5 MG-MCG Tabs Generic drug: Calcium Citrate-Vitamin D Take 1,300 mg by mouth daily.   ferrous sulfate 324 MG Tbec Take 324 mg by mouth daily with breakfast.   Fish Oil 1000 MG Caps Take 1 capsule by mouth in the morning.   ketoconazole 2 % shampoo Commonly known as: Nizoral Apply 1 Application topically 2 (two) times a week. Apply to hair and lather leave in for 3 to 5 minutes then rinse out   MULTI-VITAMIN DAILY PO Take 2 tablets by mouth  daily.   Turmeric 500 MG Caps Take 750 mg by mouth daily.          REVIEW OF SYSTEMS: A comprehensive ROS was conducted with the patient and is negative except as per HPI and below:  ROS     OBJECTIVE:  VS: BP 124/76 (BP Location: Left Arm, Patient Position: Sitting, Cuff Size: Large)   Pulse 77   Ht 5\' 2"  (1.575 m)   Wt 134 lb (60.8 kg)   SpO2 99%   BMI 24.51 kg/m    Wt Readings from Last 3 Encounters:  06/01/22 134 lb (60.8 kg)  01/18/22 135 lb (61.2 kg)  12/09/21 134 lb 3.2 oz (60.9 kg)     EXAM: General: Pt appears well and is in NAD  Neck: General: Supple without adenopathy. Thyroid: Thyroid size normal.  No goiter or nodules appreciated.  Lungs: Clear with good BS bilat   Heart: Auscultation: RRR.  Abdomen: Soft, nontender   Extremities:  BL LE: No pretibial edema   Mental Status: Judgment, insight: Intact Orientation: Oriented to time, place, and person Mood and affect: No depression, anxiety, or agitation     DATA REVIEWED: ***   DXA 04/01/2022  ASSESSMENT: The BMD measured at AP Spine L1-L4 is 0.773 g/cm2 with a T-score of-3.4. This patient is considered osteoporotic according to World Health [facility]) criteria.    Site Region Measured Date Measured Age YA BMD Significant CHANGE T-score   AP Spine L1-L4 04/01/2022         65.4         -3.4     0.773 g/cm2 * AP Spine  L1-L4      09/25/2019    62.9         -3.1    0.805 g/cm2   DualFemur Neck Right 04/01/2022    65.4         -3.1    0.608 g/cm2 DualFemur Neck Right 09/25/2019    62.9         -3.0    0.615 g/cm2   DualFemur Total Mean 04/01/2022    65.4         -2.3    0.720 g/cm2 DualFemur Total Mean 09/25/2019    62.9         -2.1    0.744 g/cm2    ASSESSMENT/PLAN/RECOMMENDATIONS:   Osteoporosis  -Patient has been diagnosed with osteoporosis in her late 34s, she has tried alendronate in the past but it caused severe esophageal side effects -Patient is on optimal calcium intake -I have encouraged the patient to pursue weightbearing exercises -Today we discussed treatment options to include bisphosphonate therapy (oral versus IV), we also discussed Prolia as well as Evenity -We discussed risk and benefit of all classes -Patient opted to pursue Evenity at this time, she has no personal history of strokes or CAD, we discussed that this will be monthly injection for 12 months, will consider bridging options in the future -If this is not going to be covered, will try Prolia  Medications : Calcium 1200 mg daily Vitamin D daily Evenity monthly  Signed electronically by: Lyndle Herrlich, MD  [facility] End[facility]  [facility] [address] [place] [place], [place] [address] Phone: [phone] FAX:  [phone]   CC: Mort Sawyers, FNP [address] Vella Raring [place] [address] Phone: [phone] Fax: [phone]   Return to Endocrinology clinic as below: Future Appointments  Date Time Provider Department Center  12/06/2022 11:30  AM Ioma Chismar, Konrad Dolores, MD [facility] None

## 2022-06-01 NOTE — Patient Instructions (Signed)

## 2022-06-02 LAB — VITAMIN D 25 HYDROXY (VIT D DEFICIENCY, FRACTURES): VITD: 46.63 ng/mL (ref 30.00–100.00)

## 2022-06-02 LAB — BASIC METABOLIC PANEL
BUN: 19 mg/dL (ref 6–23)
CO2: 29 mEq/L (ref 19–32)
Calcium: 9.7 mg/dL (ref 8.4–10.5)
Chloride: 103 mEq/L (ref 96–112)
Creatinine, Ser: 0.82 mg/dL (ref 0.40–1.20)
GFR: 74.94 mL/min (ref >60.00)
Glucose, Bld: 102 mg/dL — ABNORMAL HIGH (ref 70–99)
Potassium: 4.4 mEq/L (ref 3.5–5.1)
Sodium: 140 mEq/L (ref 135–145)

## 2022-06-02 LAB — TSH: TSH: 1.03 u[IU]/mL (ref 0.35–5.50)

## 2022-06-02 LAB — MAGNESIUM: Magnesium: 2.3 mg/dL (ref 1.5–2.5)

## 2022-06-02 LAB — PARATHYROID HORMONE, INTACT (NO CA): PTH: 20 pg/mL (ref 16–77)

## 2022-06-02 LAB — T4, FREE: Free T4: 0.97 ng/dL (ref 0.60–1.60)

## 2022-06-02 LAB — PHOSPHORUS: Phosphorus: 4.4 mg/dL (ref 2.3–4.6)

## 2022-06-03 DIAGNOSIS — M542 Cervicalgia: Secondary | ICD-10-CM | POA: Diagnosis not present

## 2022-06-03 DIAGNOSIS — M25511 Pain in right shoulder: Secondary | ICD-10-CM | POA: Diagnosis not present

## 2022-06-03 DIAGNOSIS — M5413 Radiculopathy, cervicothoracic region: Secondary | ICD-10-CM | POA: Diagnosis not present

## 2022-06-03 DIAGNOSIS — M9901 Segmental and somatic dysfunction of cervical region: Secondary | ICD-10-CM | POA: Diagnosis not present

## 2022-06-05 NOTE — Telephone Encounter (Signed)
Evenity VOB initiated via [email or web address]  Last OV: 06/01/22 Next OV: 12/06/22  NEW START

## 2022-06-06 DIAGNOSIS — M9901 Segmental and somatic dysfunction of cervical region: Secondary | ICD-10-CM | POA: Diagnosis not present

## 2022-06-06 DIAGNOSIS — M542 Cervicalgia: Secondary | ICD-10-CM | POA: Diagnosis not present

## 2022-06-06 DIAGNOSIS — M5413 Radiculopathy, cervicothoracic region: Secondary | ICD-10-CM | POA: Diagnosis not present

## 2022-06-06 DIAGNOSIS — M25511 Pain in right shoulder: Secondary | ICD-10-CM | POA: Diagnosis not present

## 2022-06-08 DIAGNOSIS — M9901 Segmental and somatic dysfunction of cervical region: Secondary | ICD-10-CM | POA: Diagnosis not present

## 2022-06-08 DIAGNOSIS — M5413 Radiculopathy, cervicothoracic region: Secondary | ICD-10-CM | POA: Diagnosis not present

## 2022-06-08 DIAGNOSIS — M25511 Pain in right shoulder: Secondary | ICD-10-CM | POA: Diagnosis not present

## 2022-06-08 DIAGNOSIS — M542 Cervicalgia: Secondary | ICD-10-CM | POA: Diagnosis not present

## 2022-06-13 NOTE — Telephone Encounter (Signed)
Prior auth required for [employer]    PA PROCESS DETAILS: Precertification is required. Call [phone] or complete the Precertification form available at [email or web address] . Or submit PA request online at [email or web address]

## 2022-06-13 NOTE — Telephone Encounter (Signed)
The preferred products are Prolia and zoledronic acid for MA plans and teriparatide for MAPD plans. The patient is required to trial BOTH Prolia and zoledronic acid for MA plans. Can the patient's treatment be switched to a preferred product?  Pt has not been on Prolia, Reclast or Teriparatide. Plan requires trial/failure/contra-indication of preferred medication prior to considering coverage of Prolia.   I did not see contra-indications for Reclast, Prolia, or Teriparatide. Please let me know if I've overlooked something.

## 2022-06-13 NOTE — Telephone Encounter (Signed)
Prior Authorization initiated for [facility] via Availity/[employer] Case ID: [id number]

## 2022-06-16 NOTE — Telephone Encounter (Signed)
Coverage for EVENITY has been DENIED.  The office will receive a denial letter from [facility] explaining reason for denial.   If you feel that Evenity was denied in error, please have denial letter scanned to pt chart and let me know that you would like for me to request an appeal.

## 2022-06-16 NOTE — Telephone Encounter (Signed)
Prior Auth for EVENITY DENIED

## 2022-06-17 NOTE — Telephone Encounter (Signed)
LDTVM and sent mychart 

## 2022-06-22 NOTE — Telephone Encounter (Signed)
Prolia VOB initiated via [email or web address] ? ?New start ? ?

## 2022-06-23 DIAGNOSIS — G4733 Obstructive sleep apnea (adult) (pediatric): Secondary | ICD-10-CM | POA: Diagnosis not present

## 2022-06-27 ENCOUNTER — Other Ambulatory Visit: Payer: Medicare HMO

## 2022-06-27 DIAGNOSIS — M542 Cervicalgia: Secondary | ICD-10-CM | POA: Diagnosis not present

## 2022-06-27 DIAGNOSIS — M25511 Pain in right shoulder: Secondary | ICD-10-CM | POA: Diagnosis not present

## 2022-06-27 DIAGNOSIS — M9901 Segmental and somatic dysfunction of cervical region: Secondary | ICD-10-CM | POA: Diagnosis not present

## 2022-06-27 DIAGNOSIS — M5413 Radiculopathy, cervicothoracic region: Secondary | ICD-10-CM | POA: Diagnosis not present

## 2022-06-28 ENCOUNTER — Other Ambulatory Visit: Payer: Self-pay

## 2022-06-28 DIAGNOSIS — M810 Age-related osteoporosis without current pathological fracture: Secondary | ICD-10-CM

## 2022-06-28 NOTE — Telephone Encounter (Signed)
Prior Auth required for PROLIA  PA PROCESS DETAILS: PA is required. Call [phone] or complete the PA form available at [email or web address] Fax completed form to [phone]. Or submit PA request online at [email or web address]  

## 2022-06-29 LAB — CREATININE, URINE, 24 HOUR: Creatinine, 24H Ur: 0.86 g/(24.h) (ref 0.50–2.15)

## 2022-06-29 LAB — CALCIUM, URINE, 24 HOUR: Calcium, 24H Urine: 405 mg/24 h — ABNORMAL HIGH

## 2022-06-30 DIAGNOSIS — M542 Cervicalgia: Secondary | ICD-10-CM | POA: Diagnosis not present

## 2022-06-30 DIAGNOSIS — M5413 Radiculopathy, cervicothoracic region: Secondary | ICD-10-CM | POA: Diagnosis not present

## 2022-06-30 DIAGNOSIS — M9901 Segmental and somatic dysfunction of cervical region: Secondary | ICD-10-CM | POA: Diagnosis not present

## 2022-06-30 DIAGNOSIS — M25511 Pain in right shoulder: Secondary | ICD-10-CM | POA: Diagnosis not present

## 2022-07-03 NOTE — Telephone Encounter (Signed)
Prior Authorization initiated for [facility] via Availity/[employer] Case ID: [id number]  Status: PENDING REVIEW

## 2022-07-04 DIAGNOSIS — M9901 Segmental and somatic dysfunction of cervical region: Secondary | ICD-10-CM | POA: Diagnosis not present

## 2022-07-04 DIAGNOSIS — M5413 Radiculopathy, cervicothoracic region: Secondary | ICD-10-CM | POA: Diagnosis not present

## 2022-07-04 DIAGNOSIS — M25511 Pain in right shoulder: Secondary | ICD-10-CM | POA: Diagnosis not present

## 2022-07-04 DIAGNOSIS — M542 Cervicalgia: Secondary | ICD-10-CM | POA: Diagnosis not present

## 2022-07-05 ENCOUNTER — Other Ambulatory Visit (HOSPITAL_COMMUNITY): Payer: Self-pay

## 2022-07-05 NOTE — Telephone Encounter (Signed)
Prior Auth APPROVED PA# [id number] Valid: 07/03/22-07/03/23

## 2022-07-05 NOTE — Telephone Encounter (Signed)
Pt ready for scheduling on or after 07/06/22  Out-of-pocket cost due at time of visit: $302  Primary: Aetna Medicare Adv PPO Prolia co-insurance: 20% (approximately $302) Admin fee co-insurance: 20% (approximately $25)  Deductible: does not apply  Prior Auth APPROVED PA# [id number] Valid: 07/03/22-07/03/23  Secondary: N/A Prolia co-insurance:  Admin fee co-insurance:  Deductible:  Prior Auth:  PA# Valid:   ** This summary of benefits is an estimation of the patient's out-of-pocket cost. Exact cost may vary based on individual plan coverage.

## 2022-07-08 DIAGNOSIS — M9901 Segmental and somatic dysfunction of cervical region: Secondary | ICD-10-CM | POA: Diagnosis not present

## 2022-07-08 DIAGNOSIS — M5413 Radiculopathy, cervicothoracic region: Secondary | ICD-10-CM | POA: Diagnosis not present

## 2022-07-08 DIAGNOSIS — M542 Cervicalgia: Secondary | ICD-10-CM | POA: Diagnosis not present

## 2022-07-08 DIAGNOSIS — M25511 Pain in right shoulder: Secondary | ICD-10-CM | POA: Diagnosis not present

## 2022-07-11 DIAGNOSIS — M25511 Pain in right shoulder: Secondary | ICD-10-CM | POA: Diagnosis not present

## 2022-07-11 DIAGNOSIS — M9901 Segmental and somatic dysfunction of cervical region: Secondary | ICD-10-CM | POA: Diagnosis not present

## 2022-07-11 DIAGNOSIS — M542 Cervicalgia: Secondary | ICD-10-CM | POA: Diagnosis not present

## 2022-07-11 DIAGNOSIS — M5413 Radiculopathy, cervicothoracic region: Secondary | ICD-10-CM | POA: Diagnosis not present

## 2022-07-18 DIAGNOSIS — M5413 Radiculopathy, cervicothoracic region: Secondary | ICD-10-CM | POA: Diagnosis not present

## 2022-07-18 DIAGNOSIS — M9901 Segmental and somatic dysfunction of cervical region: Secondary | ICD-10-CM | POA: Diagnosis not present

## 2022-07-18 DIAGNOSIS — M542 Cervicalgia: Secondary | ICD-10-CM | POA: Diagnosis not present

## 2022-07-18 DIAGNOSIS — M25511 Pain in right shoulder: Secondary | ICD-10-CM | POA: Diagnosis not present

## 2022-07-22 DIAGNOSIS — M9901 Segmental and somatic dysfunction of cervical region: Secondary | ICD-10-CM | POA: Diagnosis not present

## 2022-07-22 DIAGNOSIS — M25511 Pain in right shoulder: Secondary | ICD-10-CM | POA: Diagnosis not present

## 2022-07-22 DIAGNOSIS — M542 Cervicalgia: Secondary | ICD-10-CM | POA: Diagnosis not present

## 2022-07-22 DIAGNOSIS — M5413 Radiculopathy, cervicothoracic region: Secondary | ICD-10-CM | POA: Diagnosis not present

## 2022-07-23 DIAGNOSIS — G4733 Obstructive sleep apnea (adult) (pediatric): Secondary | ICD-10-CM | POA: Diagnosis not present

## 2022-07-26 ENCOUNTER — Telehealth: Payer: Self-pay | Admitting: Adult Health

## 2022-07-26 NOTE — Telephone Encounter (Signed)
PT has Covid and is too stuffed up to use CPAP. She is concerned with the Ins rules stating she can not miss 4 nights in a row. She has already missed 3 nights. Please call to advise. Her # is [phone]

## 2022-08-03 NOTE — Telephone Encounter (Signed)
Spoke to pt and she stated she went back to using her cpap after having covid. She is feeling better just has a runny nose. And has an appt in August with Rubye Oaks, NP. NFN

## 2022-08-05 DIAGNOSIS — M810 Age-related osteoporosis without current pathological fracture: Secondary | ICD-10-CM | POA: Diagnosis not present

## 2022-08-05 DIAGNOSIS — G4733 Obstructive sleep apnea (adult) (pediatric): Secondary | ICD-10-CM | POA: Diagnosis not present

## 2022-08-05 DIAGNOSIS — Z87440 Personal history of urinary (tract) infections: Secondary | ICD-10-CM | POA: Diagnosis not present

## 2022-08-05 DIAGNOSIS — I10 Essential (primary) hypertension: Secondary | ICD-10-CM | POA: Diagnosis not present

## 2022-08-05 DIAGNOSIS — Z79620 Long term (current) use of immunosuppressive biologic: Secondary | ICD-10-CM | POA: Diagnosis not present

## 2022-08-05 DIAGNOSIS — R32 Unspecified urinary incontinence: Secondary | ICD-10-CM | POA: Diagnosis not present

## 2022-08-05 DIAGNOSIS — Z008 Encounter for other general examination: Secondary | ICD-10-CM | POA: Diagnosis not present

## 2022-08-05 DIAGNOSIS — Z833 Family history of diabetes mellitus: Secondary | ICD-10-CM | POA: Diagnosis not present

## 2022-08-10 ENCOUNTER — Ambulatory Visit: Payer: Medicare HMO

## 2022-08-10 VITALS — BP 120/65 | HR 74 | Ht 62.0 in | Wt 132.8 lb

## 2022-08-10 DIAGNOSIS — M810 Age-related osteoporosis without current pathological fracture: Secondary | ICD-10-CM | POA: Diagnosis not present

## 2022-08-10 MED ORDER — DENOSUMAB 60 MG/ML ~~LOC~~ SOSY
60.0000 mg | PREFILLED_SYRINGE | Freq: Once | SUBCUTANEOUS | Status: AC
Start: 2022-08-10 — End: 2022-08-10
  Administered 2022-08-10: 60 mg via SUBCUTANEOUS

## 2022-08-10 NOTE — Progress Notes (Signed)
After obtaining consent, and per orders of Dr. Shamleffer, injection of Prolia given by Clytie Shetley A Purl Claytor. Patient instructed to remain in clinic for 20 minutes afterwards, and to report any adverse reaction to me immediately.  

## 2022-08-11 DIAGNOSIS — M9901 Segmental and somatic dysfunction of cervical region: Secondary | ICD-10-CM | POA: Diagnosis not present

## 2022-08-11 DIAGNOSIS — M5413 Radiculopathy, cervicothoracic region: Secondary | ICD-10-CM | POA: Diagnosis not present

## 2022-08-11 DIAGNOSIS — M542 Cervicalgia: Secondary | ICD-10-CM | POA: Diagnosis not present

## 2022-08-11 DIAGNOSIS — M25511 Pain in right shoulder: Secondary | ICD-10-CM | POA: Diagnosis not present

## 2022-08-18 ENCOUNTER — Encounter (INDEPENDENT_AMBULATORY_CARE_PROVIDER_SITE_OTHER): Payer: Self-pay

## 2022-08-20 NOTE — Telephone Encounter (Signed)
Last Prolia inj 08/10/22 Next Prolia inj due 02/11/23

## 2022-08-22 ENCOUNTER — Encounter: Payer: Self-pay | Admitting: *Deleted

## 2022-08-22 ENCOUNTER — Telehealth: Payer: Self-pay | Admitting: *Deleted

## 2022-08-22 NOTE — Telephone Encounter (Signed)
No download for patient.  LVM for patient to find out if she is wearing her CPAP.

## 2022-08-22 NOTE — Telephone Encounter (Signed)
Pt returning missed call. 

## 2022-08-23 ENCOUNTER — Ambulatory Visit: Payer: Medicare HMO

## 2022-08-23 ENCOUNTER — Encounter: Payer: Self-pay | Admitting: Adult Health

## 2022-08-23 ENCOUNTER — Ambulatory Visit: Payer: Medicare HMO | Admitting: Adult Health

## 2022-08-23 VITALS — BP 120/70 | HR 77 | Ht 62.0 in | Wt 134.2 lb

## 2022-08-23 DIAGNOSIS — G4733 Obstructive sleep apnea (adult) (pediatric): Secondary | ICD-10-CM | POA: Insufficient documentation

## 2022-08-23 DIAGNOSIS — R058 Other specified cough: Secondary | ICD-10-CM

## 2022-08-23 DIAGNOSIS — U071 COVID-19: Secondary | ICD-10-CM | POA: Diagnosis not present

## 2022-08-23 DIAGNOSIS — R052 Subacute cough: Secondary | ICD-10-CM | POA: Diagnosis not present

## 2022-08-23 DIAGNOSIS — R059 Cough, unspecified: Secondary | ICD-10-CM | POA: Diagnosis not present

## 2022-08-23 DIAGNOSIS — J40 Bronchitis, not specified as acute or chronic: Secondary | ICD-10-CM | POA: Diagnosis not present

## 2022-08-23 MED ORDER — PREDNISONE 20 MG PO TABS: 20.0000 mg | ORAL_TABLET | Freq: Every day | ORAL | 0 refills | Status: DC

## 2022-08-23 NOTE — Assessment & Plan Note (Addendum)
Excellent control compliance on nocturnal CPAP-advised to try CPAP mask liner to see if this helps with skin irritation.  If not we will have to get an alternative mask.  Plan  Patient Instructions  Use CPAP At bedtime  , wear all night long for at least 6hr or more  Do not drive if sleepy  Healthy sleep regimen  Saline nasal gel At bedtime   Follow up in 4-6 months and As needed

## 2022-08-23 NOTE — Patient Instructions (Addendum)
Use CPAP At bedtime  , wear all night long for at least 6hr or more  Try CPAP mask liners Do not drive if sleepy  Healthy sleep regimen  Saline nasal spray in am.  Saline nasal gel At bedtime   Chest xray today.  Begin Zyrtec 10mg  At bedtime  for 5 days  Begin Prednisone 20mg  daily for 5 days  Delsym 2 tsp Twice daily  for cough As needed   Follow up in 4-6 months and As needed   Please contact office for sooner follow up if symptoms do not improve or worsen or seek emergency care

## 2022-08-23 NOTE — Assessment & Plan Note (Signed)
Ongoing cough over the last 4 weeks after COVID-19 infection.  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  Will control for triggers.  Give a short course of steroids.  Check chest x-ray today.  Plan Patient Instructions  Use CPAP At bedtime  , wear all night long for at least 6hr or more  Try CPAP mask liners Do not drive if sleepy  Healthy sleep regimen  Saline nasal spray in am.  Saline nasal gel At bedtime   Chest xray today.  Begin Zyrtec 10mg  At bedtime  for 5 days  Begin Prednisone 20mg  daily for 5 days  Delsym 2 tsp Twice daily  for cough As needed   Follow up in 4-6 months and As needed   Please contact office for sooner follow up if symptoms do not improve or worsen or seek emergency care

## 2022-08-23 NOTE — Progress Notes (Signed)
@Patient  ID: Kimberly Cross, female    DOB: 1955-06-24, 67 y.o.   MRN: [id number]  Chief Complaint  Patient presents with   Follow-up    Referring provider: Mort Sawyers, FNP  HPI: 67 year old female seen for sleep consult January 18, 2022 for snoring and daytime sleepiness found to have mild obstructive sleep apnea  TEST/EVENTS :   08/23/2022 Follow up: OSA Patient presents for a 74-month follow-up.  Patient has underlying mild obstructive sleep apnea was started on CPAP therapy last visit.  Patient says she is doing well on CPAP.  Feels that she is benefiting with decreased daytime sleepiness.  CPAP download shows excellent compliance with daily average usage at 5.5 hours.  Patient is on auto CPAP 5 to 15 cm H2O.  Daily average pressure at 5.7 cm H2O.  AHI 0.4. Using Nasal mask  Has Luna CPAP . Says the mask is making some red marks along cheeks. Complains of drippy nose in the morning that lasts about 1 hour.   Patient complains of a cough that has been present for the last 4 weeks.  She says she had COVID-19 about 4 weeks ago.  Had URI-like symptoms.  Says that she is feeling better but has since residual dry minimally productive cough.  She denies any fever or discolored mucus.  No hemoptysis.  No chest pain.  Has no known underlying lung problems.  She is a never smoker.  Appetite is good with no nausea vomiting or diarrhea.  Has not tried any over-the-counter medications   No Known Allergies  Immunization History  Administered Date(s) Administered   PFIZER(Purple Top)SARS-COV-2 Vaccination 03/14/2019, 04/11/2019, 12/06/2019, 06/19/2020   PNEUMOCOCCAL CONJUGATE-20 12/09/2021   Tdap 11/30/2010, 08/06/2018   Zoster Recombinant(Shingrix) 09/02/2019, 11/13/2019    Past Medical History:  Diagnosis Date   Immunization, BCG    Osteoporosis    PVC (premature ventricular contraction)    UTI (urinary tract infection)     Tobacco History: Social History   Tobacco Use  Smoking  Status Never  Smokeless Tobacco Never   Counseling given: Not Answered   Outpatient Medications Prior to Visit  Medication Sig Dispense Refill   Calcium Citrate-Vitamin D (CALCIUM + D) 315-5 MG-MCG TABS Take 1,300 mg by mouth daily. Taking 1/2 the dose.     denosumab (PROLIA) 60 MG/ML SOSY injection Inject 60 mg into the skin every 6 (six) months.     ferrous sulfate 324 MG TBEC Take 324 mg by mouth daily with breakfast.     ketoconazole (NIZORAL) 2 % shampoo Apply 1 Application topically 2 (two) times a week. Apply to hair and lather leave in for 3 to 5 minutes then rinse out 120 mL 0   Multiple Vitamin (MULTI-VITAMIN DAILY PO) Take 2 tablets by mouth daily.     Omega-3 Fatty Acids (FISH OIL) 1000 MG CAPS Take 1 capsule by mouth in the morning.     Turmeric 500 MG CAPS Take 750 mg by mouth daily.     No facility-administered medications prior to visit.     Review of Systems:   Constitutional:   No  weight loss, night sweats,  Fevers, chills, fatigue, or  lassitude.  HEENT:   No headaches,  Difficulty swallowing,  Tooth/dental problems, or  Sore throat,                No sneezing, itching, ear ache,  +nasal congestion, post nasal drip,   CV:  No chest pain,  Orthopnea, PND, swelling in lower  extremities, anasarca, dizziness, palpitations, syncope.   GI  No heartburn, indigestion, abdominal pain, nausea, vomiting, diarrhea, change in bowel habits, loss of appetite, bloody stools.   Resp: .  No chest wall deformity  Skin: no rash or lesions.  GU: no dysuria, change in color of urine, no urgency or frequency.  No flank pain, no hematuria   MS:  No joint pain or swelling.  No decreased range of motion.  No back pain.    Physical Exam  BP 120/70 (BP Location: Left Arm, Patient Position: Sitting, Cuff Size: Normal)   Pulse 77   Ht 5\' 2"  (1.575 m)   Wt 134 lb 3.2 oz (60.9 kg)   SpO2 98%   BMI 24.55 kg/m   GEN: A/Ox3; pleasant , NAD, well nourished    HEENT:  [place]/AT,    NOSE-clear, THROAT-clear, no lesions, no postnasal drip or exudate noted.   NECK:  Supple w/ fair ROM; no JVD; normal carotid impulses w/o bruits; no thyromegaly or nodules palpated; no lymphadenopathy.    RESP  Clear  P & A; w/o, wheezes/ rales/ or rhonchi. no accessory muscle use, no dullness to percussion  CARD:  RRR, no m/r/g, no peripheral edema, pulses intact, no cyanosis or clubbing.  GI:   Soft & nt; nml bowel sounds; no organomegaly or masses detected.   Musco: Warm bil, no deformities or joint swelling noted.   Neuro: alert, no focal deficits noted.    Skin: Warm, no lesions or rashes      BNP No results found for: "BNP"  ProBNP No results found for: "PROBNP"  Imaging: No results found.  denosumab (PROLIA) injection 60 mg     Date Action Dose Route User   08/10/2022 1408 Given 60 mg Subcutaneous (Left Arm) Piedad Climes A, CMA           No data to display          No results found for: "NITRICOXIDE"      Assessment & Plan:   OSA (obstructive sleep apnea) Excellent control compliance on nocturnal CPAP-advised to try CPAP mask liner to see if this helps with skin irritation.  If not we will have to get an alternative mask.  Plan  Patient Instructions  Use CPAP At bedtime  , wear all night long for at least 6hr or more  Do not drive if sleepy  Healthy sleep regimen  Saline nasal gel At bedtime   Follow up in 4-6 months and As needed      Subacute cough Ongoing cough over the last 4 weeks after COVID-19 infection.  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  Will control for triggers.  Give a short course of steroids.  Check chest x-ray today.  Plan Patient Instructions  Use CPAP At bedtime  , wear all night long for at least 6hr or more  Try CPAP mask liners Do not drive if sleepy  Healthy sleep regimen  Saline nasal spray in am.  Saline nasal gel At bedtime   Chest xray today.  Begin Zyrtec 10mg  At bedtime  for 5 days  Begin Prednisone 20mg   daily for 5 days  Delsym 2 tsp Twice daily  for cough As needed   Follow up in 4-6 months and As needed   Please contact office for sooner follow up if symptoms do not improve or worsen or seek emergency care        Rubye Oaks, NP 08/23/2022

## 2022-08-26 NOTE — Telephone Encounter (Signed)
Patient seen Tammy on 08/23/22. Will close this encounter.

## 2022-08-31 DIAGNOSIS — R69 Illness, unspecified: Secondary | ICD-10-CM | POA: Diagnosis not present

## 2022-09-06 DIAGNOSIS — M25511 Pain in right shoulder: Secondary | ICD-10-CM | POA: Diagnosis not present

## 2022-09-06 DIAGNOSIS — M542 Cervicalgia: Secondary | ICD-10-CM | POA: Diagnosis not present

## 2022-09-06 DIAGNOSIS — M5413 Radiculopathy, cervicothoracic region: Secondary | ICD-10-CM | POA: Diagnosis not present

## 2022-09-06 DIAGNOSIS — M9901 Segmental and somatic dysfunction of cervical region: Secondary | ICD-10-CM | POA: Diagnosis not present

## 2022-09-07 ENCOUNTER — Ambulatory Visit (INDEPENDENT_AMBULATORY_CARE_PROVIDER_SITE_OTHER): Payer: Medicare HMO

## 2022-09-07 VITALS — Ht 62.0 in | Wt 130.0 lb

## 2022-09-07 DIAGNOSIS — Z0000 Encounter for general adult medical examination without abnormal findings: Secondary | ICD-10-CM

## 2022-09-07 NOTE — Patient Instructions (Signed)
Kimberly Cross , Thank you for taking time to come for your Medicare Wellness Visit. I appreciate your ongoing commitment to your health goals. Please review the following plan we discussed and let me know if I can assist you in the future.   Referrals/Orders/Follow-Ups/Clinician Recommendations: Aim for 30 minutes of exercise or brisk walking, 6-8 glasses of water, and 5 servings of fruits and vegetables each day.   This is a list of the screening recommended for you and due dates:  Health Maintenance  Topic Date Due   Medicare Annual Wellness Visit  Never done   COVID-19 Vaccine (5 - 2023-24 season) 09/04/2022   Mammogram  01/08/2024   Colon Cancer Screening  01/10/2026   Pap Smear  03/18/2026   DTaP/Tdap/Td vaccine (3 - Td or Tdap) 08/05/2028   Pneumonia Vaccine  Completed   DEXA scan (bone density measurement)  Completed   Hepatitis C Screening  Completed   HIV Screening  Completed   Zoster (Shingles) Vaccine  Completed   HPV Vaccine  Aged Out   Flu Shot  Discontinued    Advanced directives: (Copy Requested) Please bring a copy of your health care power of attorney and living will to the office to be added to your chart at your convenience.  Next Medicare Annual Wellness Visit scheduled for next year: Yes

## 2022-09-07 NOTE — Progress Notes (Signed)
Subjective:   Kimberly Cross is a 67 y.o. female who presents for Medicare Annual (Subsequent) preventive examination.  Visit Complete: Virtual  I connected with  Joan Flores on 09/07/22 by a audio enabled telemedicine application and verified that I am speaking with the correct person using two identifiers.  Patient Location: Home  Provider Location: Office/Clinic  I discussed the limitations of evaluation and management by telemedicine. The patient expressed understanding and agreed to proceed.  Patient Medicare AWV questionnaire was completed by the patient on 09/03/22; I have confirmed that all information answered by patient is correct and no changes since this date.  Vital Signs: Because this visit was a virtual/telehealth visit, some criteria may be missing or patient reported. Any vitals not documented were not able to be obtained and vitals that have been documented are patient reported.    Review of Systems      Cardiac Risk Factors include: advanced age (>56men, >60 women)     Objective:    Today's Vitals   09/07/22 1435  Weight: 130 lb (59 kg)  Height: 5\' 2"  (1.575 m)   Body mass index is 23.78 kg/m.     09/07/2022    2:41 PM  Advanced Directives  Does Patient Have a Medical Advance Directive? Yes  Type of Estate agent of [place];Living will  Copy of Healthcare Power of Attorney in Chart? No - copy requested    Current Medications (verified) Outpatient Encounter Medications as of 09/07/2022  Medication Sig   Calcium Citrate-Vitamin D (CALCIUM + D) 315-5 MG-MCG TABS Take 1,300 mg by mouth daily. Taking 1/2 the dose.   denosumab (PROLIA) 60 MG/ML SOSY injection Inject 60 mg into the skin every 6 (six) months.   ferrous sulfate 324 MG TBEC Take 324 mg by mouth daily with breakfast.   ketoconazole (NIZORAL) 2 % shampoo Apply 1 Application topically 2 (two) times a week. Apply to hair and lather leave in for 3 to 5 minutes then rinse out   Multiple  Vitamin (MULTI-VITAMIN DAILY PO) Take 2 tablets by mouth daily.   Omega-3 Fatty Acids (FISH OIL) 1000 MG CAPS Take 1 capsule by mouth in the morning.   Turmeric 500 MG CAPS Take 750 mg by mouth daily.   predniSONE (DELTASONE) 20 MG tablet Take 1 tablet (20 mg total) by mouth daily with breakfast. (Patient not taking: Reported on 09/07/2022)   No facility-administered encounter medications on file as of 09/07/2022.    Allergies (verified) Patient has no known allergies.   History: Past Medical History:  Diagnosis Date   Immunization, BCG    Osteoporosis    PVC (premature ventricular contraction)    UTI (urinary tract infection)    Past Surgical History:  Procedure Laterality Date   APPENDECTOMY     BREAST BIOPSY Left    over 20 years ago- Benign   BREAST SURGERY Left    benign breast biopsy   THUMB ARTHROSCOPY Bilateral    VARICOSE VEIN SURGERY     WISDOM TOOTH EXTRACTION     Family History  Problem Relation Age of Onset   Hyperlipidemia Mother    Alzheimer's disease Mother    Heart attack Father 69   Diabetes Father    Heart disease Father    Diabetes Brother    Diabetes Brother    Bladder Cancer Brother        in remission   Heart failure Brother        NICM   Heart  attack Brother 65   Kidney failure Maternal Grandmother        following hysterectomy   Heart attack Maternal Grandfather 110   Stroke Paternal Grandmother 65   Lung cancer Paternal Grandfather        smoker   Heart attack Paternal Uncle 25   Breast cancer Neg Hx    Social History   Socioeconomic History   Marital status: Married    Spouse name: Harvie Heck    Number of children: 2   Years of education: nursing degree   Highest education level: Not on file  Occupational History    Employer: [employer]  Tobacco Use   Smoking status: Never   Smokeless tobacco: Never  Vaping Use   Vaping status: Never Used  Substance and Sexual Activity   Alcohol use: Not Currently    Comment: 1-2  drinks per year   Drug use: Never   Sexual activity: Yes    Partners: Male    Birth control/protection: Post-menopausal  Other Topics Concern   Not on file  Social History Narrative   04/29/19   From: [place] and recently moved from [place]   Living: with husband, Harvie Heck   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      Family: Engineer, agricultural ([place]) and [place] ([place])        Enjoys: garden, Dietitian, hike      Exercise: exercises 3 times a week for 30 minutes as part of a study, yoga stretches daily   Diet: good, limits sweets, veggies/meat - chicken/fish, limits pasta      Safety   Seat belts: Yes    Guns: No   Safe in relationships: Yes    Social Determinants of Corporate investment banker Strain: Low Risk  (09/03/2022)   Overall Financial Resource Strain (CARDIA)    Difficulty of Paying Living Expenses: Not hard at all  Food Insecurity: No Food Insecurity (09/03/2022)   Hunger Vital Sign    Worried About Running Out of Food in the Last Year: Never true    Ran Out of Food in the Last Year: Never true  Transportation Needs: No Transportation Needs (09/03/2022)   PRAPARE - Administrator, Civil Service (Medical): No    Lack of Transportation (Non-Medical): No  Physical Activity: Sufficiently Active (09/03/2022)   Exercise Vital Sign    Days of Exercise per Week: 4 days    Minutes of Exercise per Session: 60 min  Stress: No Stress Concern Present (09/03/2022)   [employer] of Occupational Health - Occupational Stress Questionnaire    Feeling of Stress : Not at all  Social Connections: Moderately Isolated (09/03/2022)   Social Connection and Isolation Panel [NHANES]    Frequency of Communication with Friends and Family: More than three times a week    Frequency of Social Gatherings with Friends and Family: More than three times a week    Attends Religious Services: Never    Database administrator or Organizations: No    Attends Hospital doctor: Patient declined    Marital Status: Married    Tobacco Counseling Counseling given: Not Answered   Clinical Intake:  Pre-visit preparation completed: Yes  Pain : No/denies pain     BMI - recorded: 23.78 Nutritional Status: BMI of 19-24  Normal Nutritional Risks: None Diabetes: No  How often do you need to have someone help you when you read instructions, pamphlets, or other written materials from your doctor or  pharmacy?: 1 - Never  Interpreter Needed?: No  Information entered by :: C.Burdette Forehand LPN   Activities of Daily Living    09/03/2022    9:50 AM  In your present state of health, do you have any difficulty performing the following activities:  Hearing? 0  Vision? 0  Difficulty concentrating or making decisions? 0  Walking or climbing stairs? 0  Dressing or bathing? 0  Doing errands, shopping? 0  Preparing Food and eating ? N  Using the Toilet? N  In the past six months, have you accidently leaked urine? N  Do you have problems with loss of bowel control? N  Managing your Medications? N  Managing your Finances? N  Housekeeping or managing your Housekeeping? N    Patient Care Team: Mort Sawyers, FNP as PCP - General (Family Medicine) End, Cristal Deer, MD as Consulting Physician (Cardiology)  Indicate any recent Medical Services you may have received from other than [facility] providers in the past year (date may be approximate).     Assessment:   This is a routine wellness examination for [place].  Hearing/Vision screen Hearing Screening - Comments:: Denies hearing difficulties   Vision Screening - Comments:: [facility]- [facility]- UTD on eye exams  Dietary issues and exercise activities discussed:     Goals Addressed             This Visit's Progress    Patient Stated       Tone body, stay active,strengthen bones       Depression Screen    09/07/2022    2:37 PM 12/09/2021    8:44 AM 09/09/2021    9:13 AM 09/03/2020    8:48 AM  09/02/2019    2:33 PM 08/06/2018    9:33 AM 07/27/2017   10:00 AM  PHQ 2/9 Scores  PHQ - 2 Score 0 0 0 0 0 0 0  PHQ- 9 Score     0      Fall Risk    09/03/2022    9:50 AM 12/09/2021    8:44 AM 09/02/2019    2:33 PM 07/27/2017   10:00 AM  Fall Risk   Falls in the past year? 0 0 0 Yes  Number falls in past yr: 0 0 0 1  Injury with Fall? 0 0 0   Risk for fall due to : No Fall Risks No Fall Risks    Follow up Falls prevention discussed;Falls evaluation completed Falls evaluation completed Falls evaluation completed     MEDICARE RISK AT HOME: Medicare Risk at Home Any stairs in or around the home?: Yes If so, are there any without handrails?: No Home free of loose throw rugs in walkways, pet beds, electrical cords, etc?: Yes Adequate lighting in your home to reduce risk of falls?: Yes Life alert?: No Use of a cane, walker or w/c?: No Grab bars in the bathroom?: No Shower chair or bench in shower?: No Elevated toilet seat or a handicapped toilet?: No  TIMED UP AND GO:  Was the test performed?  No    Cognitive Function:        09/07/2022    2:42 PM  6CIT Screen  What Year? 0 points  What month? 0 points  What time? 0 points  Count back from 20 0 points  Months in reverse 0 points  Repeat phrase 0 points  Total Score 0 points    Immunizations Immunization History  Administered Date(s) Administered   PFIZER(Purple Top)SARS-COV-2 Vaccination 03/14/2019, 04/11/2019,  12/06/2019, 06/19/2020   PNEUMOCOCCAL CONJUGATE-20 12/09/2021   Tdap 11/30/2010, 08/06/2018   Zoster Recombinant(Shingrix) 09/02/2019, 11/13/2019    TDAP status: Up to date  Flu Vaccine status: Declined, Education has been provided regarding the importance of this vaccine but patient still declined. Advised may receive this vaccine at local pharmacy or Health Dept. Aware to provide a copy of the vaccination record if obtained from local pharmacy or Health Dept. Verbalized acceptance and  understanding.  Pneumococcal vaccine status: Up to date  Covid-19 vaccine status: Information provided on how to obtain vaccines.   Qualifies for Shingles Vaccine? Yes   Zostavax completed      Shingrix Completed?: Yes  Screening Tests Health Maintenance  Topic Date Due   COVID-19 Vaccine (5 - 2023-24 season) 09/04/2022   Medicare Annual Wellness (AWV)  09/07/2023   MAMMOGRAM  01/08/2024   Colonoscopy  01/10/2026   PAP SMEAR-Modifier  03/18/2026   DTaP/Tdap/Td (3 - Td or Tdap) 08/05/2028   Pneumonia Vaccine 61+ Years old  Completed   DEXA SCAN  Completed   Hepatitis C Screening  Completed   HIV Screening  Completed   Zoster Vaccines- Shingrix  Completed   HPV VACCINES  Aged Out   INFLUENZA VACCINE  Discontinued    Health Maintenance  Health Maintenance Due  Topic Date Due   COVID-19 Vaccine (5 - 2023-24 season) 09/04/2022    Colorectal cancer screening: Type of screening: Colonoscopy. Completed 01/11/16. Repeat every 10 years  Mammogram status: Completed 01/07/22. Repeat every year  Bone Density status: Completed 04/01/22. Results reflect: Bone density results: OSTEOPOROSIS. Repeat every 2 years.  Lung Cancer Screening: (Low Dose CT Chest recommended if Age 47-80 years, 20 pack-year currently smoking OR have quit w/in 15years.) does not qualify.   Lung Cancer Screening Referral:    Additional Screening:  Hepatitis C Screening: does qualify; Completed 09/02/19  Vision Screening: Recommended annual ophthalmology exams for early detection of glaucoma and other disorders of the eye. Is the patient up to date with their annual eye exam?  Yes  Who is the provider or what is the name of the office in which the patient attends annual eye exams? [facility] If pt is not established with a provider, would they like to be referred to a provider to establish care? Yes .   Dental Screening: Recommended annual dental exams for proper oral hygiene  Diabetic Foot Exam:    Community Resource Referral / Chronic Care Management: CRR required this visit?  No   CCM required this visit?  No     Plan:     I have personally reviewed and noted the following in the patient's chart:   Medical and social history Use of alcohol, tobacco or illicit drugs  Current medications and supplements including opioid prescriptions. Patient is not currently taking opioid prescriptions. Functional ability and status Nutritional status Physical activity Advanced directives List of other physicians Hospitalizations, surgeries, and ER visits in previous 12 months Vitals Screenings to include cognitive, depression, and falls Referrals and appointments  In addition, I have reviewed and discussed with patient certain preventive protocols, quality metrics, and best practice recommendations. A written personalized care plan for preventive services as well as general preventive health recommendations were provided to patient.     Maryan Puls, LPN   09/03/4780   After Visit Summary: (MyChart) Due to this being a telephonic visit, the after visit summary with patients personalized plan was offered to patient via MyChart   Nurse Notes:  none

## 2022-09-13 ENCOUNTER — Ambulatory Visit (INDEPENDENT_AMBULATORY_CARE_PROVIDER_SITE_OTHER): Payer: Medicare HMO | Admitting: Family

## 2022-09-13 ENCOUNTER — Encounter: Payer: Self-pay | Admitting: Family

## 2022-09-13 VITALS — BP 114/74 | HR 66 | Temp 97.3°F | Ht 62.0 in | Wt 131.6 lb

## 2022-09-13 DIAGNOSIS — R198 Other specified symptoms and signs involving the digestive system and abdomen: Secondary | ICD-10-CM | POA: Insufficient documentation

## 2022-09-13 DIAGNOSIS — Z0000 Encounter for general adult medical examination without abnormal findings: Secondary | ICD-10-CM | POA: Diagnosis not present

## 2022-09-13 DIAGNOSIS — G4733 Obstructive sleep apnea (adult) (pediatric): Secondary | ICD-10-CM | POA: Diagnosis not present

## 2022-09-13 DIAGNOSIS — M810 Age-related osteoporosis without current pathological fracture: Secondary | ICD-10-CM | POA: Diagnosis not present

## 2022-09-13 DIAGNOSIS — E785 Hyperlipidemia, unspecified: Secondary | ICD-10-CM

## 2022-09-13 DIAGNOSIS — R739 Hyperglycemia, unspecified: Secondary | ICD-10-CM | POA: Diagnosis not present

## 2022-09-13 LAB — LIPID PANEL
Cholesterol: 202 mg/dL — ABNORMAL HIGH (ref 0–200)
HDL: 73.1 mg/dL (ref >39.00)
LDL Cholesterol: 116 mg/dL — ABNORMAL HIGH (ref 0–99)
NonHDL: 128.72
Total CHOL/HDL Ratio: 3
Triglycerides: 66 mg/dL (ref 0.0–149.0)
VLDL: 13.2 mg/dL (ref 0.0–40.0)

## 2022-09-13 LAB — HEMOGLOBIN A1C: Hgb A1c MFr Bld: 5.7 % (ref 4.6–6.5)

## 2022-09-13 NOTE — Assessment & Plan Note (Signed)
Continue prolia as directed.continue f/u with endo as scheduled.  DEXA up to date.

## 2022-09-13 NOTE — Patient Instructions (Signed)
  Stop by the lab prior to leaving today. I will notify you of your results once received.   Recommendations on keeping yourself healthy:  - Exercise at least 30-45 minutes a day, 3-4 days a week.  - Eat a low-fat diet with lots of fruits and vegetables, up to 7-9 servings per day.  - Seatbelts can save your life. Wear them always.  - Smoke detectors on every level of your home, check batteries every year.  - Eye Doctor - have an eye exam every 1-2 years  - Safe sex - if you may be exposed to STDs, use a condom.  - Alcohol -  If you drink, do it moderately, less than 2 drinks per day.  - Health Care Power of Attorney. Choose someone to speak for you if you are not able.  - Depression is common in our stressful world.If you're feeling down or losing interest in things you normally enjoy, please come in for a visit.  - Violence - If anyone is threatening or hurting you, please call immediately.  Due to recent changes in healthcare laws, you may see results of your imaging and/or laboratory studies on MyChart before I have had a chance to review them.  I understand that in some cases there may be results that are confusing or concerning to you. Please understand that not all results are received at the same time and often I may need to interpret multiple results in order to provide you with the best plan of care or course of treatment. Therefore, I ask that you please give me 2 business days to thoroughly review all your results before contacting my office for clarification. Should we see a critical lab result, you will be contacted sooner.   I will see you again in one year for your annual comprehensive exam unless otherwise stated and or with acute concerns.  It was a pleasure seeing you today! Please do not hesitate to reach out with any questions and or concerns.  Regards,   Tabitha Dugal    

## 2022-09-13 NOTE — Progress Notes (Signed)
Subjective:  Patient ID: Kimberly Cross, female    DOB: 1955/09/13  Age: 67 y.o. MRN: [id number]  Patient Care Team: Mort Sawyers, FNP as PCP - General (Family Medicine) End, Cristal Deer, MD as Consulting Physician (Cardiology)   CC:  Chief Complaint  Patient presents with   Annual Exam    HPI Kimberly Cross is a 67 y.o. female who presents today for an annual physical exam. She reports consuming a general diet.  She states as far as exercise keeps 'active'  She generally feels well. She reports sleeping fairly well. She does not have additional problems to discuss today.   Vision:Within last year Dental:Receives regular dental care Declines flu shot today   Mammogram: 01/07/22 Last pap: 03/17/2021 Colonoscopy: 11/18/2015, Q10 y Bone density scan: 04/01/2022 With osteoporosis, seeing endo Dr. Lonzo Cloud. DEXA up to date. Recently started on prolia injections. 24 hour urine collection abn, endo to repeat in six months while trying to work on dietary factors to r/o cause. Advised to limit calcium to 1000 mg daily, also advised to limit salt intake and intake of protein.   Pt is without acute concerns.  Has noticed increase in bowel movements, since starting the prolia injection. Every time she goes to the bathroom she has a small bowel movement. At times she feels as though she has a stomach bug, on and off a few days then will feel good.  Not diarrhea, normal formed soft stool.she has also had a recent diet change with endo and might be taking in more vegetables then she used to.    Advanced Directives Patient does have advanced directives   DEPRESSION SCREENING    09/13/2022    8:17 AM 09/07/2022    2:37 PM 12/09/2021    8:44 AM 09/09/2021    9:13 AM 09/03/2020    8:48 AM 09/02/2019    2:33 PM 08/06/2018    9:33 AM  PHQ 2/9 Scores  PHQ - 2 Score 0 0 0 0 0 0 0  PHQ- 9 Score 0     0      ROS: Negative unless specifically indicated above in HPI.    Current Outpatient Medications:     Calcium Citrate-Vitamin D (CALCIUM + D) 315-5 MG-MCG TABS, Take 1,300 mg by mouth daily. Taking 1/2 the dose., Disp: , Rfl:    denosumab (PROLIA) 60 MG/ML SOSY injection, Inject 60 mg into the skin every 6 (six) months., Disp: , Rfl:    ferrous sulfate 324 MG TBEC, Take 324 mg by mouth daily with breakfast., Disp: , Rfl:    ketoconazole (NIZORAL) 2 % shampoo, Apply 1 Application topically 2 (two) times a week. Apply to hair and lather leave in for 3 to 5 minutes then rinse out, Disp: 120 mL, Rfl: 0   Multiple Vitamin (MULTI-VITAMIN DAILY PO), Take 2 tablets by mouth daily., Disp: , Rfl:    Omega-3 Fatty Acids (FISH OIL) 1000 MG CAPS, Take 1 capsule by mouth in the morning., Disp: , Rfl:    Turmeric 500 MG CAPS, Take 750 mg by mouth daily., Disp: , Rfl:     Objective:    BP 114/74 (BP Location: Right Arm, Patient Position: Sitting, Cuff Size: Normal)   Pulse 66   Temp (!) 97.3 F (36.3 C) (Temporal)   Ht 5\' 2"  (1.575 m)   Wt 131 lb 9.6 oz (59.7 kg)   SpO2 99%   BMI 24.07 kg/m   BP Readings from Last 3 Encounters:  09/13/22 114/74  08/23/22 120/70  08/10/22 120/65      Physical Exam Constitutional:      General: She is not in acute distress.    Appearance: Normal appearance. She is normal weight. She is not ill-appearing.  HENT:     Head: Normocephalic.     Right Ear: Tympanic membrane normal.     Left Ear: Tympanic membrane normal.     Nose: Nose normal.     Mouth/Throat:     Mouth: Mucous membranes are moist.  Eyes:     Extraocular Movements: Extraocular movements intact.     Pupils: Pupils are equal, round, and reactive to light.  Cardiovascular:     Rate and Rhythm: Normal rate and regular rhythm.  Pulmonary:     Effort: Pulmonary effort is normal.     Breath sounds: Normal breath sounds.  Abdominal:     General: Abdomen is flat. Bowel sounds are normal.     Palpations: Abdomen is soft.     Tenderness: There is no guarding or rebound.  Musculoskeletal:         General: Normal range of motion.     Cervical back: Normal range of motion.  Skin:    General: Skin is warm.     Capillary Refill: Capillary refill takes less than 2 seconds.  Neurological:     General: No focal deficit present.     Mental Status: She is alert.  Psychiatric:        Mood and Affect: Mood normal.        Behavior: Behavior normal.        Thought Content: Thought content normal.        Judgment: Judgment normal.          Assessment & Plan:  Mild obstructive sleep apnea Assessment & Plan: Continue CPAP  F/u with pulmonary prn   Hyperglycemia Assessment & Plan: A1c ordered today pending results.  Pt advised of the following: Work on a diabetic diet, try to incorporate exercise at least 20-30 a day for 3 days a week or more.    Orders: -     Hemoglobin A1c  Encounter for general adult medical examination without abnormal findings Assessment & Plan: Patient Counseling(The following topics were reviewed):  Preventative care handout given to pt  Health maintenance and immunizations reviewed. Please refer to Health maintenance section. Pt advised on safe sex, wearing seatbelts in car, and proper nutrition labwork ordered today for annual Dental health: Discussed importance of regular tooth brushing, flossing, and dental visits.  Pt declines flu shot today   Orders: -     Lipid panel -     Hemoglobin A1c  Hyperlipidemia, unspecified hyperlipidemia type Assessment & Plan: Ordered lipid panel, pending results. Work on low cholesterol diet and exercise as tolerated   Orders: -     Lipid panel  Change in bowel movement Assessment & Plan: Suspect change in dietary intake vs covid residual s/e Fodmap diet discussed.  Prolia also could be s/e although less likely  Advised pt to try dietary modifications, f/u if ongoing or worsening   Osteoporosis, unspecified osteoporosis type, unspecified pathological fracture presence Assessment & Plan: Continue  prolia as directed.continue f/u with endo as scheduled.  DEXA up to date.        Follow-up: Return in about 1 year (around 09/13/2023) for f/u CPE.   Mort Sawyers, FNP

## 2022-09-13 NOTE — Assessment & Plan Note (Signed)
A1c ordered today pending results.  Pt advised of the following: Work on a diabetic diet, try to incorporate exercise at least 20-30 a day for 3 days a week or more.

## 2022-09-13 NOTE — Assessment & Plan Note (Signed)
Patient Counseling(The following topics were reviewed):  Preventative care handout given to pt  Health maintenance and immunizations reviewed. Please refer to Health maintenance section. Pt advised on safe sex, wearing seatbelts in car, and proper nutrition labwork ordered today for annual Dental health: Discussed importance of regular tooth brushing, flossing, and dental visits.  Pt declines flu shot today

## 2022-09-13 NOTE — Assessment & Plan Note (Signed)
Continue CPAP  F/u with pulmonary prn

## 2022-09-13 NOTE — Assessment & Plan Note (Signed)
Suspect change in dietary intake vs covid residual s/e Fodmap diet discussed.  Prolia also could be s/e although less likely  Advised pt to try dietary modifications, f/u if ongoing or worsening

## 2022-09-13 NOTE — Assessment & Plan Note (Signed)
Ordered lipid panel, pending results. Work on low cholesterol diet and exercise as tolerated  

## 2022-09-15 ENCOUNTER — Encounter: Payer: Self-pay | Admitting: Family

## 2022-09-23 DIAGNOSIS — G4733 Obstructive sleep apnea (adult) (pediatric): Secondary | ICD-10-CM | POA: Diagnosis not present

## 2022-10-04 DIAGNOSIS — M25511 Pain in right shoulder: Secondary | ICD-10-CM | POA: Diagnosis not present

## 2022-10-04 DIAGNOSIS — M5413 Radiculopathy, cervicothoracic region: Secondary | ICD-10-CM | POA: Diagnosis not present

## 2022-10-04 DIAGNOSIS — M542 Cervicalgia: Secondary | ICD-10-CM | POA: Diagnosis not present

## 2022-10-04 DIAGNOSIS — M9901 Segmental and somatic dysfunction of cervical region: Secondary | ICD-10-CM | POA: Diagnosis not present

## 2022-10-11 ENCOUNTER — Encounter: Payer: Self-pay | Admitting: Family

## 2022-10-11 ENCOUNTER — Ambulatory Visit (INDEPENDENT_AMBULATORY_CARE_PROVIDER_SITE_OTHER): Payer: Medicare HMO | Admitting: Family

## 2022-10-11 VITALS — BP 118/70 | HR 78 | Temp 97.8°F | Ht 62.0 in | Wt 133.0 lb

## 2022-10-11 DIAGNOSIS — S4991XA Unspecified injury of right shoulder and upper arm, initial encounter: Secondary | ICD-10-CM | POA: Insufficient documentation

## 2022-10-11 DIAGNOSIS — M25611 Stiffness of right shoulder, not elsewhere classified: Secondary | ICD-10-CM | POA: Diagnosis not present

## 2022-10-11 DIAGNOSIS — M25511 Pain in right shoulder: Secondary | ICD-10-CM

## 2022-10-11 NOTE — Progress Notes (Signed)
   Established Patient Office Visit  Subjective:   Patient ID: Kimberly Cross, female    DOB: 1955/09/20  Age: 67 y.o. MRN: [id number]  CC:  Chief Complaint  Patient presents with   Shoulder Pain    HPI: Sarinity Dicicco is a 68 y.o. female presenting on 10/11/2022 for Shoulder Pain  Earlier this year she was doing yoga stretches and she felt a pop in her right shoulder resulting in needing help with dressing and grooming. Went and saw her chiropractor and worked on some stretches with some improvement. However, a few weeks ago went kayaking and then played pickle ball and now again with pain with movement and is waking her up at night. Hard for her to lie on her right shoulder and worse with lying on her back.   She has used some bio freeze with mild relief.  She did use tylenol and ibuprofen with only mild relief.         ROS: Negative unless specifically indicated above in HPI.   Relevant past medical history reviewed and updated as indicated.   Allergies and medications reviewed and updated.   Current Outpatient Medications:    Calcium Citrate-Vitamin D (CALCIUM + D) 315-5 MG-MCG TABS, Take 1,300 mg by mouth daily. Taking 1/2 the dose., Disp: , Rfl:    denosumab (PROLIA) 60 MG/ML SOSY injection, Inject 60 mg into the skin every 6 (six) months., Disp: , Rfl:    ferrous sulfate 324 MG TBEC, Take 324 mg by mouth daily with breakfast., Disp: , Rfl:    ketoconazole (NIZORAL) 2 % shampoo, Apply 1 Application topically 2 (two) times a week. Apply to hair and lather leave in for 3 to 5 minutes then rinse out, Disp: 120 mL, Rfl: 0   Multiple Vitamin (MULTI-VITAMIN DAILY PO), Take 2 tablets by mouth daily., Disp: , Rfl:    Omega-3 Fatty Acids (FISH OIL) 1000 MG CAPS, Take 1 capsule by mouth in the morning., Disp: , Rfl:    Turmeric 500 MG CAPS, Take 750 mg by mouth daily., Disp: , Rfl:   No Known Allergies  Objective:   BP 118/70   Pulse 78   Temp 97.8 F (36.6 C)   Ht 5\' 2"  (1.575 m)    Wt 133 lb (60.3 kg)   SpO2 100%   BMI 24.33 kg/m    Physical Exam Musculoskeletal:     Right shoulder: No swelling. Decreased range of motion (pain felt at 90 degree, can not elevate above). Decreased strength.       Arms:     Assessment & Plan:  Acute pain of right shoulder -     Ambulatory referral to Orthopedic Surgery  Decreased range of motion of right shoulder -     Ambulatory referral to Orthopedic Surgery  Injury of right shoulder, initial encounter Assessment & Plan: Suspect from overuse, rotator cuff tear vs sprain Exercises given to pt  Referral placed for orthopedist Advised pt to refrain from any pain invoking exercises for now.  Heat/ice/ibuprofen/tylenol/biofreeze prn  Consider physical therapy  Defer xray today as suspect cuff  Orders: -     Ambulatory referral to Orthopedic Surgery     Follow up plan: Return if symptoms worsen or fail to improve.  Mort Sawyers, FNP

## 2022-10-11 NOTE — Assessment & Plan Note (Signed)
Suspect from overuse, rotator cuff tear vs sprain Exercises given to pt  Referral placed for orthopedist Advised pt to refrain from any pain invoking exercises for now.  Heat/ice/ibuprofen/tylenol/biofreeze prn  Consider physical therapy  Defer xray today as suspect cuff

## 2022-10-12 ENCOUNTER — Encounter: Payer: Self-pay | Admitting: *Deleted

## 2022-10-23 DIAGNOSIS — G4733 Obstructive sleep apnea (adult) (pediatric): Secondary | ICD-10-CM | POA: Diagnosis not present

## 2022-11-08 DIAGNOSIS — M5413 Radiculopathy, cervicothoracic region: Secondary | ICD-10-CM | POA: Diagnosis not present

## 2022-11-08 DIAGNOSIS — M9901 Segmental and somatic dysfunction of cervical region: Secondary | ICD-10-CM | POA: Diagnosis not present

## 2022-11-08 DIAGNOSIS — M542 Cervicalgia: Secondary | ICD-10-CM | POA: Diagnosis not present

## 2022-11-08 DIAGNOSIS — M25511 Pain in right shoulder: Secondary | ICD-10-CM | POA: Diagnosis not present

## 2022-11-23 DIAGNOSIS — G4733 Obstructive sleep apnea (adult) (pediatric): Secondary | ICD-10-CM | POA: Diagnosis not present

## 2022-11-25 DIAGNOSIS — M7521 Bicipital tendinitis, right shoulder: Secondary | ICD-10-CM | POA: Diagnosis not present

## 2022-11-25 DIAGNOSIS — G8929 Other chronic pain: Secondary | ICD-10-CM | POA: Diagnosis not present

## 2022-11-25 DIAGNOSIS — M7581 Other shoulder lesions, right shoulder: Secondary | ICD-10-CM | POA: Diagnosis not present

## 2022-11-25 DIAGNOSIS — M25511 Pain in right shoulder: Secondary | ICD-10-CM | POA: Diagnosis not present

## 2022-12-06 ENCOUNTER — Encounter: Payer: Self-pay | Admitting: Internal Medicine

## 2022-12-06 ENCOUNTER — Ambulatory Visit: Payer: Medicare HMO | Admitting: Internal Medicine

## 2022-12-06 VITALS — BP 128/70 | HR 78 | Ht 62.0 in | Wt 137.0 lb

## 2022-12-06 DIAGNOSIS — M9901 Segmental and somatic dysfunction of cervical region: Secondary | ICD-10-CM | POA: Diagnosis not present

## 2022-12-06 DIAGNOSIS — R82994 Hypercalciuria: Secondary | ICD-10-CM | POA: Diagnosis not present

## 2022-12-06 DIAGNOSIS — M5413 Radiculopathy, cervicothoracic region: Secondary | ICD-10-CM | POA: Diagnosis not present

## 2022-12-06 DIAGNOSIS — M25511 Pain in right shoulder: Secondary | ICD-10-CM | POA: Diagnosis not present

## 2022-12-06 DIAGNOSIS — M542 Cervicalgia: Secondary | ICD-10-CM | POA: Diagnosis not present

## 2022-12-06 DIAGNOSIS — M810 Age-related osteoporosis without current pathological fracture: Secondary | ICD-10-CM

## 2022-12-06 NOTE — Patient Instructions (Signed)
24-Hour Urine Collection   You will be collecting your urine for a 24-hour period of time.  Your timer starts with your first urine of the morning (For example - If you first pee at 9AM, your timer will start at 9AM)  Throw away your first urine of the morning  Collect your urine every time you pee for the next 24 hours STOP your urine collection 24 hours after you started the collection (For example - You would stop at 9AM the day after you started)  

## 2022-12-06 NOTE — Progress Notes (Unsigned)
Name: Kimberly Cross  MRN/ DOB: [id number], [birth year]    Age/ Sex: 67 y.o., female    PCP: Mort Sawyers, FNP   Reason for Endocrinology Evaluation: Osteoporosis     Date of Initial Endocrinology Evaluation: 06/01/2022    HPI: [name] is a 67 y.o. female with a past medical history of PVC's, PVD, osteoporosis . The patient presented for initial endocrinology clinic visit on 06/01/2022 for consultative assistance with her osteoporosis.   Pt was diagnosed with osteoporosis: She was diagnosed in late 6s , based on our records DXA 2021 with AP spine -3.1   Menarche at age : 53 Menopausal at age : 55 Fracture Hx: no Hx of HRT: no FH of osteoporosis or hip fracture: mother  Prior Hx of anti-resorptive therapy : Fosamax for few months in her 34's but tore her esophagus.  We initially opted for Evenity but this was declined by her insurance and we opted to proceed with Prolia.  She received her first Prolia injection on 08/10/2022  SUBJECTIVE:    Today (12/06/22):  Kimberly Cross is here for follow-up on osteoporosis.   She has been following up with orthopedics to the [facility] for rotator cuff tendinitis on the right  She has been achy intermittently that she attributed to Prolia   No rash   Denies heartburn  Denies constipation or diarrhea  Denies recent falls    Alive Calcium 1 tabs contains  650 mg/50 total  MVI 2 gummies have    /200/ vitD 500 iu  Protein powder : contains 60 mg of calcium daily         HISTORY:  Past Medical History:  Past Medical History:  Diagnosis Date   Immunization, BCG    Osteoporosis    PVC (premature ventricular contraction)    UTI (urinary tract infection)    Past Surgical History:  Past Surgical History:  Procedure Laterality Date   APPENDECTOMY     BREAST BIOPSY Left    over 20 years ago- Benign   BREAST SURGERY Left    benign breast biopsy   THUMB ARTHROSCOPY Bilateral    VARICOSE VEIN SURGERY     WISDOM TOOTH  EXTRACTION      Social History:  reports that she has never smoked. She has never used smokeless tobacco. She reports that she does not currently use alcohol. She reports that she does not use drugs. Family History: family history includes Alzheimer's disease in her mother; Bladder Cancer in her brother; Diabetes in her brother, brother, and father; Heart attack (age of onset: 52) in her paternal uncle; Heart attack (age of onset: 31) in her brother; Heart attack (age of onset: 22) in her father; Heart attack (age of onset: 36) in her maternal grandfather; Heart disease in her father; Heart failure in her brother; Hyperlipidemia in her mother; Kidney failure in her maternal grandmother; Lung cancer in her paternal grandfather; Stroke (age of onset: 65) in her paternal grandmother.   HOME MEDICATIONS: Allergies as of 12/06/2022   No Known Allergies      Medication List        Accurate as of December 06, 2022 11:54 AM. If you have any questions, ask your nurse or doctor.          Calcium + D 315-5 MG-MCG Tabs Generic drug: Calcium Citrate-Vitamin D Take 1,300 mg by mouth daily. Taking 1/2 the dose.   denosumab 60 MG/ML Sosy injection Commonly known as: PROLIA Inject 60 mg  into the skin every 6 (six) months.   ferrous sulfate 324 MG Tbec Take 324 mg by mouth daily with breakfast.   Fish Oil 1000 MG Caps Take 1 capsule by mouth in the morning.   ketoconazole 2 % shampoo Commonly known as: Nizoral Apply 1 Application topically 2 (two) times a week. Apply to hair and lather leave in for 3 to 5 minutes then rinse out   MULTI-VITAMIN DAILY PO Take 2 tablets by mouth daily.   Turmeric 500 MG Caps Take 750 mg by mouth daily.          REVIEW OF SYSTEMS: A comprehensive ROS was conducted with the patient and is negative except as per HPI    OBJECTIVE:  VS: BP 128/70   Pulse 78   Ht 5\' 2"  (1.575 m)   Wt 137 lb (62.1 kg)   SpO2 98%   BMI 25.06 kg/m    Wt Readings from  Last 3 Encounters:  12/06/22 137 lb (62.1 kg)  10/11/22 133 lb (60.3 kg)  09/13/22 131 lb 9.6 oz (59.7 kg)     EXAM: General: Pt appears well and is in NAD  Neck: General: Supple without adenopathy. Thyroid: Thyroid size normal.  No goiter or nodules appreciated.  Lungs: Clear with good BS bilat   Heart: Auscultation: RRR.  Abdomen: Soft, nontender  Extremities:  BL LE: No pretibial edema   Mental Status: Judgment, insight: Intact Orientation: Oriented to time, place, and person Mood and affect: No depression, anxiety, or agitation     DATA REVIEWED:   Latest Reference Range & Units 12/06/22 12:07  Sodium 135 - 146 mmol/L 139  Potassium 3.5 - 5.3 mmol/L 4.5  Chloride 98 - 110 mmol/L 102  CO2 20 - 32 mmol/L 30  Glucose 65 - 99 mg/dL 91  BUN 7 - 25 mg/dL 19  Creatinine 1.61 - 0.96 mg/dL 0.45  Calcium 8.6 - 40.9 mg/dL 9.9  BUN/Creatinine Ratio 6 - 22 (calc) SEE NOTE:  Vitamin D, 25-Hydroxy 30 - 100 ng/mL 40  TSH 0.40 - 4.50 mIU/L 1.87  T4,Free(Direct) 0.8 - 1.8 ng/dL 1.1     DXA 08/13/9145  ASSESSMENT: The BMD measured at AP Spine L1-L4 is 0.773 g/cm2 with a T-score of-3.4. This patient is considered osteoporotic according to World Health Organization [facility]) criteria.    Site Region Measured Date Measured Age YA BMD Significant CHANGE T-score   AP Spine L1-L4 04/01/2022         65.4         -3.4     0.773 g/cm2 * AP Spine  L1-L4      09/25/2019    62.9         -3.1    0.805 g/cm2   DualFemur Neck Right 04/01/2022    65.4         -3.1    0.608 g/cm2 DualFemur Neck Right 09/25/2019    62.9         -3.0    0.615 g/cm2   DualFemur Total Mean 04/01/2022    65.4         -2.3    0.720 g/cm2 DualFemur Total Mean 09/25/2019    62.9         -2.1    0.744 g/cm2    ASSESSMENT/PLAN/RECOMMENDATIONS:   Osteoporosis  -Patient has been diagnosed with osteoporosis in her late 48s, she has tried alendronate in the past but it caused severe esophageal side effects -Patient was  started on  Prolia 2024, she attributes occasional muscle aches and pains to it, we discussed considering the risk versus the benefit -Emphasized the importance of optimizing calcium and vitamin D intake -BMP, TFTs, vitamin D are normal   Medications : Calcium 1200 mg daily Vitamin D daily   2. Hypercalciuria:   -She had an elevated 24-hour urine calcium 405 Mg in 06/2022 -She did reduce the amount of calcium by 50% -We will repeat   Follow-up in 1 year  Signed electronically by: Lyndle Herrlich, MD  [facility]  [facility] [address] [place] [place], [place] [address] Phone: [phone] FAX: [phone]   CC: Mort Sawyers, FNP [address] [place] [address] Phone: [phone] Fax: [phone]   Return to Endocrinology clinic as below: Future Appointments  Date Time Provider Department Center  02/14/2023 10:00 AM [facility] NURSE [facility] None  09/18/2023  2:00 PM [facility] ANNUAL WELLNESS VISIT 1 [facility]

## 2022-12-07 LAB — BASIC METABOLIC PANEL
BUN: 19 mg/dL (ref 7–25)
CO2: 30 mmol/L (ref 20–32)
Calcium: 9.9 mg/dL (ref 8.6–10.4)
Chloride: 102 mmol/L (ref 98–110)
Creat: 0.53 mg/dL (ref 0.50–1.05)
Glucose, Bld: 91 mg/dL (ref 65–99)
Potassium: 4.5 mmol/L (ref 3.5–5.3)
Sodium: 139 mmol/L (ref 135–146)

## 2022-12-07 LAB — VITAMIN D 25 HYDROXY (VIT D DEFICIENCY, FRACTURES): Vit D, 25-Hydroxy: 40 ng/mL (ref 30–100)

## 2022-12-07 LAB — PARATHYROID HORMONE, INTACT (NO CA): PTH: 30 pg/mL (ref 16–77)

## 2022-12-07 LAB — TSH: TSH: 1.87 m[IU]/L (ref 0.40–4.50)

## 2022-12-07 LAB — T4, FREE: Free T4: 1.1 ng/dL (ref 0.8–1.8)

## 2022-12-08 DIAGNOSIS — M25511 Pain in right shoulder: Secondary | ICD-10-CM | POA: Diagnosis not present

## 2022-12-08 DIAGNOSIS — G8929 Other chronic pain: Secondary | ICD-10-CM | POA: Diagnosis not present

## 2022-12-12 ENCOUNTER — Other Ambulatory Visit: Payer: Medicare HMO

## 2022-12-12 ENCOUNTER — Other Ambulatory Visit: Payer: Self-pay

## 2022-12-12 DIAGNOSIS — H5203 Hypermetropia, bilateral: Secondary | ICD-10-CM | POA: Diagnosis not present

## 2022-12-12 DIAGNOSIS — M810 Age-related osteoporosis without current pathological fracture: Secondary | ICD-10-CM | POA: Diagnosis not present

## 2022-12-13 ENCOUNTER — Telehealth: Payer: Self-pay | Admitting: Internal Medicine

## 2022-12-13 LAB — CREATININE, URINE, 24 HOUR: Creatinine, 24H Ur: 0.87 g/(24.h) (ref 0.50–2.15)

## 2022-12-13 LAB — CALCIUM, URINE, 24 HOUR: Calcium, 24H Urine: 258 mg/(24.h) — ABNORMAL HIGH

## 2022-12-13 NOTE — Telephone Encounter (Signed)
Can you please make sure that they are also running 24-hour urine calcium on this patient?  The only resulted the creatinine    Thanks

## 2022-12-14 DIAGNOSIS — G8929 Other chronic pain: Secondary | ICD-10-CM | POA: Diagnosis not present

## 2022-12-14 DIAGNOSIS — M25511 Pain in right shoulder: Secondary | ICD-10-CM | POA: Diagnosis not present

## 2022-12-19 DIAGNOSIS — D2272 Melanocytic nevi of left lower limb, including hip: Secondary | ICD-10-CM | POA: Diagnosis not present

## 2022-12-19 DIAGNOSIS — L821 Other seborrheic keratosis: Secondary | ICD-10-CM | POA: Diagnosis not present

## 2022-12-19 DIAGNOSIS — D2271 Melanocytic nevi of right lower limb, including hip: Secondary | ICD-10-CM | POA: Diagnosis not present

## 2022-12-19 DIAGNOSIS — D2262 Melanocytic nevi of left upper limb, including shoulder: Secondary | ICD-10-CM | POA: Diagnosis not present

## 2022-12-19 DIAGNOSIS — D225 Melanocytic nevi of trunk: Secondary | ICD-10-CM | POA: Diagnosis not present

## 2022-12-19 DIAGNOSIS — L814 Other melanin hyperpigmentation: Secondary | ICD-10-CM | POA: Diagnosis not present

## 2022-12-19 DIAGNOSIS — D2261 Melanocytic nevi of right upper limb, including shoulder: Secondary | ICD-10-CM | POA: Diagnosis not present

## 2022-12-21 DIAGNOSIS — R531 Weakness: Secondary | ICD-10-CM | POA: Diagnosis not present

## 2022-12-21 DIAGNOSIS — M25611 Stiffness of right shoulder, not elsewhere classified: Secondary | ICD-10-CM | POA: Diagnosis not present

## 2022-12-21 DIAGNOSIS — G8929 Other chronic pain: Secondary | ICD-10-CM | POA: Diagnosis not present

## 2022-12-21 DIAGNOSIS — M25511 Pain in right shoulder: Secondary | ICD-10-CM | POA: Diagnosis not present

## 2022-12-23 DIAGNOSIS — G4733 Obstructive sleep apnea (adult) (pediatric): Secondary | ICD-10-CM | POA: Diagnosis not present

## 2022-12-30 DIAGNOSIS — M25511 Pain in right shoulder: Secondary | ICD-10-CM | POA: Diagnosis not present

## 2022-12-30 DIAGNOSIS — G8929 Other chronic pain: Secondary | ICD-10-CM | POA: Diagnosis not present

## 2023-01-04 NOTE — Telephone Encounter (Signed)
 Prolia VOB initiated via [email or web address]  Next Prolia inj DUE: 02/11/23

## 2023-01-06 DIAGNOSIS — G8929 Other chronic pain: Secondary | ICD-10-CM | POA: Diagnosis not present

## 2023-01-06 DIAGNOSIS — M25511 Pain in right shoulder: Secondary | ICD-10-CM | POA: Diagnosis not present

## 2023-01-10 DIAGNOSIS — M5413 Radiculopathy, cervicothoracic region: Secondary | ICD-10-CM | POA: Diagnosis not present

## 2023-01-10 DIAGNOSIS — M542 Cervicalgia: Secondary | ICD-10-CM | POA: Diagnosis not present

## 2023-01-10 DIAGNOSIS — M25511 Pain in right shoulder: Secondary | ICD-10-CM | POA: Diagnosis not present

## 2023-01-10 DIAGNOSIS — M9901 Segmental and somatic dysfunction of cervical region: Secondary | ICD-10-CM | POA: Diagnosis not present

## 2023-01-10 NOTE — Telephone Encounter (Signed)
 [employer] - Prior Authorization REQUIRED   PA PROCESS DETAILS: PA is required. Call [phone] or complete the PA form available at  [email or web address]   Fax completed form to [phone]. Or submit PA  request online at [email or web address]

## 2023-01-15 NOTE — Telephone Encounter (Signed)
 Prior Authorization initiated for [facility] via [employer] Case ID: [id number]

## 2023-01-16 DIAGNOSIS — M5413 Radiculopathy, cervicothoracic region: Secondary | ICD-10-CM | POA: Diagnosis not present

## 2023-01-16 DIAGNOSIS — M9901 Segmental and somatic dysfunction of cervical region: Secondary | ICD-10-CM | POA: Diagnosis not present

## 2023-01-16 DIAGNOSIS — M542 Cervicalgia: Secondary | ICD-10-CM | POA: Diagnosis not present

## 2023-01-16 DIAGNOSIS — M25511 Pain in right shoulder: Secondary | ICD-10-CM | POA: Diagnosis not present

## 2023-01-18 DIAGNOSIS — G4733 Obstructive sleep apnea (adult) (pediatric): Secondary | ICD-10-CM | POA: Diagnosis not present

## 2023-01-18 DIAGNOSIS — H26493 Other secondary cataract, bilateral: Secondary | ICD-10-CM | POA: Diagnosis not present

## 2023-01-18 NOTE — Telephone Encounter (Signed)
 APPROVED  PA# [id number] Valid: 07/03/22-07/03/23

## 2023-01-23 DIAGNOSIS — G4733 Obstructive sleep apnea (adult) (pediatric): Secondary | ICD-10-CM | POA: Diagnosis not present

## 2023-02-06 DIAGNOSIS — M9901 Segmental and somatic dysfunction of cervical region: Secondary | ICD-10-CM | POA: Diagnosis not present

## 2023-02-06 DIAGNOSIS — M5413 Radiculopathy, cervicothoracic region: Secondary | ICD-10-CM | POA: Diagnosis not present

## 2023-02-06 DIAGNOSIS — M542 Cervicalgia: Secondary | ICD-10-CM | POA: Diagnosis not present

## 2023-02-06 DIAGNOSIS — M25511 Pain in right shoulder: Secondary | ICD-10-CM | POA: Diagnosis not present

## 2023-02-06 NOTE — Telephone Encounter (Signed)
Patient is ready for scheduling on or after 02/11/23 BUY AND BILL  Out-of-pocket cost due at time of visit: $356.87  Primary: [employer]na Medicare Adv PPO Prolia co-insurance: 20% (approximately $331.87) Admin fee co-insurance: 20% (approximately $25)  Deductible: does not apply  Prior Auth: APPROVED PA# [id number] Valid: 07/03/22-07/03/23  Secondary: N/A Prolia co-insurance:  Admin fee co-insurance:  Deductible:  Prior Auth:  PA# Valid:   ** This summary of benefits is an estimation of the patient's out-of-pocket cost. Exact cost may vary based on individual plan coverage.

## 2023-02-14 ENCOUNTER — Ambulatory Visit: Payer: Medicare HMO

## 2023-02-14 VITALS — BP 142/70 | HR 70 | Resp 20 | Ht 62.0 in | Wt 137.4 lb

## 2023-02-14 DIAGNOSIS — M810 Age-related osteoporosis without current pathological fracture: Secondary | ICD-10-CM

## 2023-02-14 MED ORDER — DENOSUMAB 60 MG/ML ~~LOC~~ SOSY: 60.0000 mg | PREFILLED_SYRINGE | Freq: Once | SUBCUTANEOUS | Status: AC

## 2023-02-14 MED ORDER — DENOSUMAB 60 MG/ML ~~LOC~~ SOSY
60.0000 mg | PREFILLED_SYRINGE | Freq: Once | SUBCUTANEOUS | Status: AC
  Administered 2023-02-14: 60 mg via SUBCUTANEOUS

## 2023-02-14 NOTE — Telephone Encounter (Signed)
Last Prolia inj 02/1123 Next Prolia inj due 08/15/23

## 2023-02-14 NOTE — Progress Notes (Signed)
After obtaining consent, and per orders of Dr. Lonzo Cloud, injection of Prolia given by Tera Partridge. Patient instructed to remain in clinic for 20 minutes afterwards, and to report any adverse reaction to me immediately.

## 2023-02-21 DIAGNOSIS — H903 Sensorineural hearing loss, bilateral: Secondary | ICD-10-CM | POA: Diagnosis not present

## 2023-02-21 DIAGNOSIS — J300 Vasomotor rhinitis: Secondary | ICD-10-CM | POA: Diagnosis not present

## 2023-02-21 DIAGNOSIS — J301 Allergic rhinitis due to pollen: Secondary | ICD-10-CM | POA: Diagnosis not present

## 2023-02-21 DIAGNOSIS — H9313 Tinnitus, bilateral: Secondary | ICD-10-CM | POA: Diagnosis not present

## 2023-02-23 DIAGNOSIS — G4733 Obstructive sleep apnea (adult) (pediatric): Secondary | ICD-10-CM | POA: Diagnosis not present

## 2023-03-07 DIAGNOSIS — M9901 Segmental and somatic dysfunction of cervical region: Secondary | ICD-10-CM | POA: Diagnosis not present

## 2023-03-07 DIAGNOSIS — M25511 Pain in right shoulder: Secondary | ICD-10-CM | POA: Diagnosis not present

## 2023-03-07 DIAGNOSIS — M5413 Radiculopathy, cervicothoracic region: Secondary | ICD-10-CM | POA: Diagnosis not present

## 2023-03-07 DIAGNOSIS — M542 Cervicalgia: Secondary | ICD-10-CM | POA: Diagnosis not present

## 2023-03-21 DIAGNOSIS — J301 Allergic rhinitis due to pollen: Secondary | ICD-10-CM | POA: Diagnosis not present

## 2023-03-23 DIAGNOSIS — G4733 Obstructive sleep apnea (adult) (pediatric): Secondary | ICD-10-CM | POA: Diagnosis not present

## 2023-03-24 DIAGNOSIS — H903 Sensorineural hearing loss, bilateral: Secondary | ICD-10-CM | POA: Diagnosis not present

## 2023-03-24 DIAGNOSIS — H9311 Tinnitus, right ear: Secondary | ICD-10-CM | POA: Diagnosis not present

## 2023-03-24 DIAGNOSIS — J301 Allergic rhinitis due to pollen: Secondary | ICD-10-CM | POA: Diagnosis not present

## 2023-03-29 ENCOUNTER — Other Ambulatory Visit: Payer: Self-pay | Admitting: Otolaryngology

## 2023-03-29 DIAGNOSIS — H9311 Tinnitus, right ear: Secondary | ICD-10-CM

## 2023-04-03 DIAGNOSIS — M5413 Radiculopathy, cervicothoracic region: Secondary | ICD-10-CM | POA: Diagnosis not present

## 2023-04-03 DIAGNOSIS — M542 Cervicalgia: Secondary | ICD-10-CM | POA: Diagnosis not present

## 2023-04-03 DIAGNOSIS — M9901 Segmental and somatic dysfunction of cervical region: Secondary | ICD-10-CM | POA: Diagnosis not present

## 2023-04-03 DIAGNOSIS — M25511 Pain in right shoulder: Secondary | ICD-10-CM | POA: Diagnosis not present

## 2023-04-04 DIAGNOSIS — L820 Inflamed seborrheic keratosis: Secondary | ICD-10-CM | POA: Diagnosis not present

## 2023-04-04 DIAGNOSIS — D485 Neoplasm of uncertain behavior of skin: Secondary | ICD-10-CM | POA: Diagnosis not present

## 2023-04-20 ENCOUNTER — Ambulatory Visit
Admission: RE | Admit: 2023-04-20 | Discharge: 2023-04-20 | Disposition: A | Discharge status: Home / Self Care | Source: Ambulatory Visit | Attending: Otolaryngology | Admitting: Otolaryngology

## 2023-04-20 DIAGNOSIS — H9311 Tinnitus, right ear: Secondary | ICD-10-CM

## 2023-04-20 MED ORDER — GADOPICLENOL 0.5 MMOL/ML IV SOLN
7.5000 mL | Freq: Once | INTRAVENOUS | Status: AC | PRN
  Administered 2023-04-20: 6 mL via INTRAVENOUS

## 2023-05-09 DIAGNOSIS — M5413 Radiculopathy, cervicothoracic region: Secondary | ICD-10-CM | POA: Diagnosis not present

## 2023-05-09 DIAGNOSIS — M542 Cervicalgia: Secondary | ICD-10-CM | POA: Diagnosis not present

## 2023-05-09 DIAGNOSIS — M9901 Segmental and somatic dysfunction of cervical region: Secondary | ICD-10-CM | POA: Diagnosis not present

## 2023-05-09 DIAGNOSIS — M25511 Pain in right shoulder: Secondary | ICD-10-CM | POA: Diagnosis not present

## 2023-06-06 DIAGNOSIS — M542 Cervicalgia: Secondary | ICD-10-CM | POA: Diagnosis not present

## 2023-06-06 DIAGNOSIS — M9901 Segmental and somatic dysfunction of cervical region: Secondary | ICD-10-CM | POA: Diagnosis not present

## 2023-06-06 DIAGNOSIS — M25511 Pain in right shoulder: Secondary | ICD-10-CM | POA: Diagnosis not present

## 2023-06-06 DIAGNOSIS — M5413 Radiculopathy, cervicothoracic region: Secondary | ICD-10-CM | POA: Diagnosis not present

## 2023-07-04 DIAGNOSIS — M5413 Radiculopathy, cervicothoracic region: Secondary | ICD-10-CM | POA: Diagnosis not present

## 2023-07-04 DIAGNOSIS — M542 Cervicalgia: Secondary | ICD-10-CM | POA: Diagnosis not present

## 2023-07-04 DIAGNOSIS — M25511 Pain in right shoulder: Secondary | ICD-10-CM | POA: Diagnosis not present

## 2023-07-04 DIAGNOSIS — M9901 Segmental and somatic dysfunction of cervical region: Secondary | ICD-10-CM | POA: Diagnosis not present

## 2023-07-24 NOTE — Telephone Encounter (Signed)
 Prolia  VOB initiated via [email or web address]  Next Prolia  inj DUE: 08/15/23

## 2023-07-31 NOTE — Telephone Encounter (Signed)
 Kimberly Cross, this patient has a Prolia  scheduled on 08.12.2025 but no authorization, no $ due at check in, nor authorization time frame

## 2023-08-01 DIAGNOSIS — G4733 Obstructive sleep apnea (adult) (pediatric): Secondary | ICD-10-CM | POA: Diagnosis not present

## 2023-08-01 NOTE — Telephone Encounter (Signed)
 Medical [employer] and Ra[employer] - Prior Authorization REQUIRED for [employer]   PA PROCESS DETAILS: PA is required. Call [phone] or complete the PA form available at  [email or web address]   Fax completed form to [phone]. Or submit PA  request online at [email or web address]

## 2023-08-05 NOTE — Telephone Encounter (Signed)
 Prior Authorization initiated for PROLIA  via [employer] Case ID: [id number]  Pending review

## 2023-08-08 NOTE — Telephone Encounter (Signed)
 Prior Authorization for PROLIA  APPROVED PA# [id number] Valid: 08/05/23-08/04/24

## 2023-08-08 NOTE — Telephone Encounter (Signed)
 [employer]  Patient is ready for scheduling on or after 08/15/23  Out-of-pocket cost due at time of visit: $356.87   Primary: [employer]na Medicare Advantage PPO Prolia  co-insurance: 20% (approximately $331.87) Admin fee co-insurance: 20% (approximately $25)  Deductible: does not apply  Prior Auth: APPROVED PA# [id number] Valid: 08/05/23-08/04/24  Secondary: N/A Prolia  co-insurance:  Admin fee co-insurance:  Deductible:  Prior Auth:  PA# Valid:   ** This summary of benefits is an estimation of the patient's out-of-pocket cost. Exact cost may vary based on individual plan coverage.

## 2023-08-15 ENCOUNTER — Ambulatory Visit

## 2023-08-16 DIAGNOSIS — M5413 Radiculopathy, cervicothoracic region: Secondary | ICD-10-CM | POA: Diagnosis not present

## 2023-08-16 DIAGNOSIS — M542 Cervicalgia: Secondary | ICD-10-CM | POA: Diagnosis not present

## 2023-08-16 DIAGNOSIS — M9901 Segmental and somatic dysfunction of cervical region: Secondary | ICD-10-CM | POA: Diagnosis not present

## 2023-08-16 DIAGNOSIS — M25511 Pain in right shoulder: Secondary | ICD-10-CM | POA: Diagnosis not present

## 2023-08-18 ENCOUNTER — Ambulatory Visit

## 2023-08-18 VITALS — BP 128/72 | HR 77 | Resp 16 | Ht 62.0 in | Wt 138.4 lb

## 2023-08-18 DIAGNOSIS — M810 Age-related osteoporosis without current pathological fracture: Secondary | ICD-10-CM | POA: Diagnosis not present

## 2023-08-18 MED ORDER — DENOSUMAB 60 MG/ML ~~LOC~~ SOSY
60.0000 mg | PREFILLED_SYRINGE | Freq: Once | SUBCUTANEOUS | Status: AC
  Administered 2023-08-18: 60 mg via SUBCUTANEOUS

## 2023-08-18 MED ORDER — DENOSUMAB 60 MG/ML ~~LOC~~ SOSY: 60.0000 mg | PREFILLED_SYRINGE | Freq: Once | SUBCUTANEOUS | Status: AC

## 2023-08-18 NOTE — Progress Notes (Signed)
 After obtaining consent, and per orders of Dr. Sam, injection of Prolia given by Dietrich LOISE Lax. Patient instructed to remain in clinic for 20 minutes afterwards, and to report any adverse reaction to me immediately.

## 2023-08-19 NOTE — Telephone Encounter (Signed)
 Last Prolia  inj 08/18/23 Next Prolia  inj due 02/19/24

## 2023-09-05 DIAGNOSIS — M9901 Segmental and somatic dysfunction of cervical region: Secondary | ICD-10-CM | POA: Diagnosis not present

## 2023-09-05 DIAGNOSIS — M25511 Pain in right shoulder: Secondary | ICD-10-CM | POA: Diagnosis not present

## 2023-09-05 DIAGNOSIS — M542 Cervicalgia: Secondary | ICD-10-CM | POA: Diagnosis not present

## 2023-09-05 DIAGNOSIS — M5413 Radiculopathy, cervicothoracic region: Secondary | ICD-10-CM | POA: Diagnosis not present

## 2023-09-18 ENCOUNTER — Ambulatory Visit (INDEPENDENT_AMBULATORY_CARE_PROVIDER_SITE_OTHER): Payer: Medicare HMO

## 2023-09-18 VITALS — BP 128/72 | Ht 62.0 in | Wt 140.0 lb

## 2023-09-18 DIAGNOSIS — Z0000 Encounter for general adult medical examination without abnormal findings: Secondary | ICD-10-CM | POA: Diagnosis not present

## 2023-09-18 DIAGNOSIS — Z2821 Immunization not carried out because of patient refusal: Secondary | ICD-10-CM | POA: Diagnosis not present

## 2023-09-18 NOTE — Progress Notes (Signed)
 Because this visit was a virtual/telehealth visit,  certain criteria was not obtained, such a blood pressure, CBG if applicable, and timed get up and go. Any medications not marked as taking were not mentioned during the medication reconciliation part of the visit. Any vitals not documented were not able to be obtained due to this being a telehealth visit or patient was unable to self-report a recent blood pressure reading due to a lack of equipment at home via telehealth. Vitals that have been documented are verbally provided by the patient.   This visit was performed by a medical professional under my direct supervision. I was immediately available for consultation/collaboration. I have reviewed and agree with the Annual Wellness Visit documentation.  Subjective:   Kimberly Cross is a 68 y.o. who presents for a Medicare Wellness preventive visit.  As a reminder, Annual Wellness Visits don't include a physical exam, and some assessments may be limited, especially if this visit is performed virtually. We may recommend an in-person follow-up visit with your provider if needed.  Visit Complete: Virtual I connected with  Kimberly Cross on 09/18/23 by a audio enabled telemedicine application and verified that I am speaking with the correct person using two identifiers.  Patient Location: Home  Provider Location: Home Office  I discussed the limitations of evaluation and management by telemedicine. The patient expressed understanding and agreed to proceed.  Vital Signs: Because this visit was a virtual/telehealth visit, some criteria may be missing or patient reported. Any vitals not documented were not able to be obtained and vitals that have been documented are patient reported.  VideoDeclined- This patient declined Librarian, academic. Therefore the visit was completed with audio only.  Persons Participating in Visit: Patient.  AWV Questionnaire: Yes: Patient Medicare AWV  questionnaire was completed by the patient on 09/17/2023; I have confirmed that all information answered by patient is correct and no changes since this date.  Cardiac Risk Factors include: advanced age (>55men, >62 women);dyslipidemia     Objective:    Today's Vitals   09/18/23 1440  BP: 128/72  Weight: 140 lb (63.5 kg)  Height: 5' 2 (1.575 m)   Body mass index is 25.61 kg/m.     09/18/2023    2:39 PM 09/07/2022    2:41 PM  Advanced Directives  Does Patient Have a Medical Advance Directive? Yes Yes  Type of Estate agent of [place];Living will Healthcare Power of [place];Living will  Does patient want to make changes to medical advance directive? No - Patient declined   Copy of Healthcare Power of Attorney in Chart? No - copy requested No - copy requested    Current Medications (verified) Outpatient Encounter Medications as of 09/18/2023  Medication Sig   Calcium Citrate-Vitamin D  (CALCIUM + D) 315-5 MG-MCG TABS Take 1,300 mg by mouth daily. Taking 1/2 the dose.   denosumab  (PROLIA ) 60 MG/ML SOSY injection Inject 60 mg into the skin every 6 (six) months.   ferrous sulfate 324 MG TBEC Take 324 mg by mouth daily with breakfast.   ketoconazole  (NIZORAL ) 2 % shampoo Apply 1 Application topically 2 (two) times a week. Apply to hair and lather leave in for 3 to 5 minutes then rinse out   Multiple Vitamin (MULTI-VITAMIN DAILY PO) Take 2 tablets by mouth daily.   Turmeric 500 MG CAPS Take 750 mg by mouth daily.   Omega-3 Fatty Acids (FISH OIL) 1000 MG CAPS Take 1 capsule by mouth in the morning.  Facility-Administered Encounter Medications as of 09/18/2023  Medication   denosumab  (PROLIA ) injection 60 mg   [START ON 02/18/2024] denosumab  (PROLIA ) injection 60 mg    Allergies (verified) Patient has no known allergies.   History: Past Medical History:  Diagnosis Date   Immunization, BCG    Osteoporosis    PVC (premature ventricular contraction)    UTI  (urinary tract infection)    Past Surgical History:  Procedure Laterality Date   APPENDECTOMY     BREAST BIOPSY Left    over 20 years ago- Benign   BREAST SURGERY Left    benign breast biopsy   THUMB ARTHROSCOPY Bilateral    VARICOSE VEIN SURGERY     WISDOM TOOTH EXTRACTION     Family History  Problem Relation Age of Onset   Hyperlipidemia Mother    Alzheimer's disease Mother    Heart attack Father 54   Diabetes Father    Heart disease Father    Diabetes Brother    Diabetes Brother    Bladder Cancer Brother        in remission   Heart failure Brother        NICM   Heart attack Brother 50   Kidney failure Maternal Grandmother        following hysterectomy   Heart attack Maternal Grandfather 49   Stroke Paternal Grandmother 58   Lung cancer Paternal Grandfather        smoker   Heart attack Paternal Uncle 1   Breast cancer Neg Hx    Social History   Socioeconomic History   Marital status: Married    Spouse name: Kimberly Cross    Number of children: 2   Years of education: nursing degree   Highest education level: Associate degree: occupational, Scientist, product/process development, or vocational program  Occupational History    Employer: [employer]  Tobacco Use   Smoking status: Never   Smokeless tobacco: Never  Vaping Use   Vaping status: Never Used  Substance and Sexual Activity   Alcohol use: Not Currently    Comment: 1-2 drinks per year   Drug use: Never   Sexual activity: Yes    Partners: Male    Birth control/protection: Post-menopausal  Other Topics Concern   Not on file  Social History Narrative   04/29/19   From: [place] and recently moved from [place]    Living: with husband, Kimberly Cross   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      Family: Kimberly Cross ([place] ) and Agricultural engineer ([place] )        Enjoys: garden, Dietitian, hike      Exercise: exercises 3 times a week for 30 minutes as part of a study, yoga stretches daily   Diet: good, limits sweets, veggies/meat -  chicken/fish, limits pasta      Safety   Seat belts: Yes    Guns: No   Safe in relationships: Yes    Social Drivers of Corporate investment banker Strain: Low Risk  (09/18/2023)   Overall Financial Resource Strain (CARDIA)    Difficulty of Paying Living Expenses: Not hard at all  Food Insecurity: No Food Insecurity (09/17/2023)   Hunger Vital Sign    Worried About Running Out of Food in the Last Year: Never true    Ran Out of Food in the Last Year: Never true  Transportation Needs: No Transportation Needs (09/18/2023)   PRAPARE - Administrator, Civil Service (Medical): No    Lack of Transportation (Non-Medical):  No  Physical Activity: Insufficiently Active (09/18/2023)   Exercise Vital Sign    Days of Exercise per Week: 3 days    Minutes of Exercise per Session: 40 min  Stress: No Stress Concern Present (09/18/2023)   [employer] of Occupational Health - Occupational Stress Questionnaire    Feeling of Stress: Not at all  Social Connections: Moderately Integrated (09/18/2023)   Social Connection and Isolation Panel    Frequency of Communication with Friends and Family: More than three times a week    Frequency of Social Gatherings with Friends and Family: More than three times a week    Attends Religious Services: 1 to 4 times per year    Active Member of [employer] or Organizations: No    Attends Engineer, structural: Patient declined    Marital Status: Married    Tobacco Counseling Counseling given: Not Answered    Clinical Intake:  Pre-visit preparation completed: Yes  Pain : No/denies pain     Nutritional Risks: None Diabetes: No  Lab Results  Component Value Date   HGBA1C 5.7 09/13/2022     How often do you need to have someone help you when you read instructions, pamphlets, or other written materials from your doctor or pharmacy?: 1 - Never  Interpreter Needed?: No  Information entered by :: Larae Caison,cma   Activities of  Daily Living     09/17/2023    4:05 PM  In your present state of health, do you have any difficulty performing the following activities:  Hearing? 0  Vision? 0  Difficulty concentrating or making decisions? 0  Walking or climbing stairs? 0  Dressing or bathing? 0  Doing errands, shopping? 0  Preparing Food and eating ? N  Using the Toilet? N  In the past six months, have you accidently leaked urine? N  Do you have problems with loss of bowel control? N  Managing your Medications? N  Managing your Finances? N  Housekeeping or managing your Housekeeping? N    Patient Care Team: Corwin Antu, FNP as PCP - General (Family Medicine) End, Lonni, MD as Consulting Physician (Cardiology)  I have updated your Care Teams any recent Medical Services you may have received from other providers in the past year.     Assessment:   This is a routine wellness examination for Kimberly Cross.  Hearing/Vision screen Hearing Screening - Comments:: No difficulties Vision Screening - Comments:: Wears readers   Goals Addressed             This Visit's Progress    Weight (lb) < 200 lb (90.7 kg)   140 lb (63.5 kg)      Depression Screen     09/18/2023    2:45 PM 10/11/2022    7:48 AM 09/13/2022    8:17 AM 09/07/2022    2:37 PM 12/09/2021    8:44 AM 09/09/2021    9:13 AM 09/03/2020    8:48 AM  PHQ 2/9 Scores  PHQ - 2 Score 0 0 0 0 0 0 0  PHQ- 9 Score 0 0 0        Fall Risk     09/17/2023    4:05 PM 10/11/2022    7:48 AM 09/13/2022    8:17 AM 09/03/2022    9:50 AM 12/09/2021    8:44 AM  Fall Risk   Falls in the past year? 0 1 0 0 0  Number falls in past yr: 0 1 0 0 0  Injury  with Fall? 0 1 0 0 0  Risk for fall due to : No Fall Risks No Fall Risks No Fall Risks No Fall Risks No Fall Risks  Follow up Falls evaluation completed Falls evaluation completed Falls evaluation completed Falls prevention discussed;Falls evaluation completed Falls evaluation completed      Data saved with a previous  flowsheet row definition    MEDICARE RISK AT HOME:  Medicare Risk at Home Any stairs in or around the home?: (Patient-Rptd) Yes If so, are there any without handrails?: (Patient-Rptd) No Home free of loose throw rugs in walkways, pet beds, electrical cords, etc?: (Patient-Rptd) Yes Adequate lighting in your home to reduce risk of falls?: (Patient-Rptd) Yes Life alert?: (Patient-Rptd) No Use of a cane, walker or w/c?: (Patient-Rptd) No Grab bars in the bathroom?: (Patient-Rptd) No Shower chair or bench in shower?: (Patient-Rptd) No Elevated toilet seat or a handicapped toilet?: (Patient-Rptd) No  TIMED UP AND GO:  Was the test performed?  no  Cognitive Function: 6CIT completed        09/18/2023    2:45 PM 09/07/2022    2:42 PM  6CIT Screen  What Year? 0 points 0 points  What month? 0 points 0 points  What time? 0 points 0 points  Count back from 20 0 points 0 points  Months in reverse 0 points 0 points  Repeat phrase 0 points 0 points  Total Score 0 points 0 points    Immunizations Immunization History  Administered Date(s) Administered   PFIZER(Purple Top)SARS-COV-2 Vaccination 03/14/2019, 04/11/2019, 12/06/2019, 06/19/2020   PNEUMOCOCCAL CONJUGATE-20 12/09/2021   Tdap 11/30/2010, 08/06/2018   Zoster Recombinant(Shingrix) 09/02/2019, 11/13/2019    Screening Tests Health Maintenance  Topic Date Due   Influenza Vaccine  Never done   COVID-19 Vaccine (5 - 2025-26 season) 09/04/2023   Mammogram  01/08/2024   DEXA SCAN  03/31/2024   Medicare Annual Wellness (AWV)  09/17/2024   Colonoscopy  01/10/2026   DTaP/Tdap/Td (3 - Td or Tdap) 08/05/2028   Pneumococcal Vaccine: 50+ Years  Completed   Hepatitis C Screening  Completed   Zoster Vaccines- Shingrix  Completed   HPV VACCINES  Aged Out   Meningococcal B Vaccine  Aged Out    Health Maintenance Items Addressed:patient declined vaccinations   Additional Screening:  Vision Screening: Recommended annual  ophthalmology exams for early detection of glaucoma and other disorders of the eye. Is the patient up to date with their annual eye exam?  No  patient will get eye exam soon Who is the provider or what is the name of the office in which the patient attends annual eye exams?   Dental Screening: Recommended annual dental exams for proper oral hygiene  Community Resource Referral / Chronic Care Management: CRR required this visit?  No   CCM required this visit?  No   Plan:    I have personally reviewed and noted the following in the patient's chart:   Medical and social history Use of alcohol, tobacco or illicit drugs  Current medications and supplements including opioid prescriptions. Patient is not currently taking opioid prescriptions. Functional ability and status Nutritional status Physical activity Advanced directives List of other physicians Hospitalizations, surgeries, and ER visits in previous 12 months Vitals Screenings to include cognitive, depression, and falls Referrals and appointments  In addition, I have reviewed and discussed with patient certain preventive protocols, quality metrics, and best practice recommendations. A written personalized care plan for preventive services as well as general preventive health recommendations  were provided to patient.   Lyle MARLA Right, [place]   09/18/2023   After Visit Summary: (MyChart) Due to this being a telephonic visit, the after visit summary with patients personalized plan was offered to patient via MyChart   Notes: Nothing significant to report at this time.

## 2023-09-18 NOTE — Patient Instructions (Signed)
 Kimberly Cross,  Thank you for taking the time for your Medicare Wellness Visit. I appreciate your continued commitment to your health goals. Please review the care plan we discussed, and feel free to reach out if I can assist you further.  Medicare recommends these wellness visits once per year to help you and your care team stay ahead of potential health issues. These visits are designed to focus on prevention, allowing your provider to concentrate on managing your acute and chronic conditions during your regular appointments.  Please note that Annual Wellness Visits do not include a physical exam. Some assessments may be limited, especially if the visit was conducted virtually. If needed, we may recommend a separate in-person follow-up with your provider.  Ongoing Care Seeing your primary care provider every 3 to 6 months helps us  monitor your health and provide consistent, personalized care.   Referrals If a referral was made during today's visit and you haven't received any updates within two weeks, please contact the referred provider directly to check on the status.  Recommended Screenings:  Health Maintenance  Topic Date Due   Flu Shot  Never done   COVID-19 Vaccine (5 - 2025-26 season) 09/04/2023   Breast Cancer Screening  01/08/2024   DEXA scan (bone density measurement)  03/31/2024   Medicare Annual Wellness Visit  09/17/2024   Colon Cancer Screening  01/10/2026   DTaP/Tdap/Td vaccine (3 - Td or Tdap) 08/05/2028   Pneumococcal Vaccine for age over 6  Completed   Hepatitis C Screening  Completed   Zoster (Shingles) Vaccine  Completed   HPV Vaccine  Aged Out   Meningitis B Vaccine  Aged Out       09/18/2023    2:39 PM  Advanced Directives  Does Patient Have a Medical Advance Directive? Yes  Type of Estate agent of [place];Living will  Does patient want to make changes to medical advance directive? No - Patient declined  Copy of Healthcare Power of  Attorney in Chart? No - copy requested   Advance Care Planning is important because it: Ensures you receive medical care that aligns with your values, goals, and preferences. Provides guidance to your family and loved ones, reducing the emotional burden of decision-making during critical moments.  Vision: Annual vision screenings are recommended for early detection of glaucoma, cataracts, and diabetic retinopathy. These exams can also reveal signs of chronic conditions such as diabetes and high blood pressure.  Dental: Annual dental screenings help detect early signs of oral cancer, gum disease, and other conditions linked to overall health, including heart disease and diabetes.  Please see the attached documents for additional preventive care recommendations.

## 2023-10-10 DIAGNOSIS — M5413 Radiculopathy, cervicothoracic region: Secondary | ICD-10-CM | POA: Diagnosis not present

## 2023-10-10 DIAGNOSIS — M25511 Pain in right shoulder: Secondary | ICD-10-CM | POA: Diagnosis not present

## 2023-10-10 DIAGNOSIS — M9904 Segmental and somatic dysfunction of sacral region: Secondary | ICD-10-CM | POA: Diagnosis not present

## 2023-10-10 DIAGNOSIS — M9901 Segmental and somatic dysfunction of cervical region: Secondary | ICD-10-CM | POA: Diagnosis not present

## 2023-10-10 DIAGNOSIS — M542 Cervicalgia: Secondary | ICD-10-CM | POA: Diagnosis not present

## 2023-10-10 DIAGNOSIS — M9902 Segmental and somatic dysfunction of thoracic region: Secondary | ICD-10-CM | POA: Diagnosis not present

## 2023-10-12 ENCOUNTER — Ambulatory Visit: Payer: Self-pay | Admitting: Family

## 2023-10-12 ENCOUNTER — Ambulatory Visit: Admitting: Family

## 2023-10-12 ENCOUNTER — Encounter: Payer: Self-pay | Admitting: Family

## 2023-10-12 VITALS — BP 126/72 | HR 83 | Temp 98.3°F | Ht 62.0 in | Wt 134.8 lb

## 2023-10-12 DIAGNOSIS — Z1231 Encounter for screening mammogram for malignant neoplasm of breast: Secondary | ICD-10-CM

## 2023-10-12 DIAGNOSIS — Z0000 Encounter for general adult medical examination without abnormal findings: Secondary | ICD-10-CM | POA: Diagnosis not present

## 2023-10-12 DIAGNOSIS — E782 Mixed hyperlipidemia: Secondary | ICD-10-CM

## 2023-10-12 DIAGNOSIS — Z136 Encounter for screening for cardiovascular disorders: Secondary | ICD-10-CM

## 2023-10-12 DIAGNOSIS — M2550 Pain in unspecified joint: Secondary | ICD-10-CM | POA: Diagnosis not present

## 2023-10-12 LAB — CBC
HCT: 41.4 % (ref 36.0–46.0)
Hemoglobin: 13.8 g/dL (ref 12.0–15.0)
MCHC: 33.3 g/dL (ref 30.0–36.0)
MCV: 90.4 fl (ref 78.0–100.0)
Platelets: 247 K/uL (ref 150.0–400.0)
RBC: 4.58 Mil/uL (ref 3.87–5.11)
RDW: 12.9 % (ref 11.5–15.5)
WBC: 6.5 K/uL (ref 4.0–10.5)

## 2023-10-12 LAB — BASIC METABOLIC PANEL WITH GFR
BUN: 18 mg/dL (ref 6–23)
CO2: 30 meq/L (ref 19–32)
Calcium: 9.3 mg/dL (ref 8.4–10.5)
Chloride: 103 meq/L (ref 96–112)
Creatinine, Ser: 0.62 mg/dL (ref 0.40–1.20)
GFR: 92.42 mL/min (ref >60.00)
Glucose, Bld: 96 mg/dL (ref 70–99)
Potassium: 4.2 meq/L (ref 3.5–5.1)
Sodium: 140 meq/L (ref 135–145)

## 2023-10-12 LAB — LIPID PANEL
Cholesterol: 233 mg/dL — ABNORMAL HIGH (ref 0–200)
HDL: 74.1 mg/dL (ref >39.00)
LDL Cholesterol: 134 mg/dL — ABNORMAL HIGH (ref 0–99)
NonHDL: 158.59
Total CHOL/HDL Ratio: 3
Triglycerides: 123 mg/dL (ref 0.0–149.0)
VLDL: 24.6 mg/dL (ref 0.0–40.0)

## 2023-10-12 NOTE — Progress Notes (Signed)
 Subjective:  Patient ID: Kimberly Cross, female    DOB: 07/26/1955  Age: 68 y.o. MRN: [id number]  Patient Care Team: Corwin Antu, FNP as PCP - General (Family Medicine) End, Lonni, MD as Consulting Physician (Cardiology)   CC:  Chief Complaint  Patient presents with   Annual Exam    HPI Kimberly Cross is a 68 y.o. female who presents today for an annual physical exam. She reports consuming a general diet. Cycling, walking, kayaking She generally feels well. She reports sleeping well. She does not have additional problems to discuss today.   Vision:Within last year Dental:Receives regular dental care  Mammogram: 01/2022 Last pap: > 71 y/o  Colonoscopy: 11/2015 every ten years  Bone density scan:04/01/2022  Pt is without acute concerns.   Discussed the use of AI scribe software for clinical note transcription with the patient, who gave verbal consent to proceed.  History of Present Illness Kimberly Cross is a 68 year old female who presents for an annual physical exam.   She experiences swelling in her left hand, particularly in the finger where she wears her wedding ring. There is no pain, but the swelling has increased over time. She uses turmeric for joint health.  No back pain and she reports regular chiropractic visits. No family history of autoimmune diseases or rheumatoid arthritis.   Advanced Directives Patient does not have advanced directives. She does not have a copy in the electronic medical record.   DEPRESSION SCREENING    10/12/2023    8:40 AM 09/18/2023    2:45 PM 10/11/2022    7:48 AM 09/13/2022    8:17 AM 09/07/2022    2:37 PM 12/09/2021    8:44 AM 09/09/2021    9:13 AM  PHQ 2/9 Scores  PHQ - 2 Score 0 0 0 0 0 0 0  PHQ- 9 Score 0 0 0 0        ROS: Negative unless specifically indicated above in HPI.    Current Outpatient Medications:    Calcium Citrate-Vitamin D  (CALCIUM + D) 315-5 MG-MCG TABS, Take 1,300 mg by mouth daily. Taking 1/2 the dose., Disp: , Rfl:     denosumab  (PROLIA ) 60 MG/ML SOSY injection, Inject 60 mg into the skin every 6 (six) months., Disp: , Rfl:    ferrous sulfate 324 MG TBEC, Take 324 mg by mouth daily with breakfast., Disp: , Rfl:    ketoconazole  (NIZORAL ) 2 % shampoo, Apply 1 Application topically 2 (two) times a week. Apply to hair and lather leave in for 3 to 5 minutes then rinse out, Disp: 120 mL, Rfl: 0   Multiple Vitamin (MULTI-VITAMIN DAILY PO), Take 2 tablets by mouth daily., Disp: , Rfl:    Omega-3 Fatty Acids (FISH OIL) 1000 MG CAPS, Take 1 capsule by mouth in the morning., Disp: , Rfl:    Turmeric 500 MG CAPS, Take 750 mg by mouth daily., Disp: , Rfl:   Current Facility-Administered Medications:    denosumab  (PROLIA ) injection 60 mg, 60 mg, Subcutaneous, Once, Shamleffer, Ibtehal Jaralla, MD   [START ON 02/18/2024] denosumab  (PROLIA ) injection 60 mg, 60 mg, Subcutaneous, Once, Shamleffer, Ibtehal Jaralla, MD    Objective:    BP 126/72 (BP Location: Left Arm, Patient Position: Sitting, Cuff Size: Normal)   Pulse 83   Temp 98.3 F (36.8 C) (Temporal)   Ht 5' 2 (1.575 m)   Wt 134 lb 12.8 oz (61.1 kg)   SpO2 98%   BMI 24.66 kg/m   BP Readings  from Last 3 Encounters:  10/12/23 126/72  09/18/23 128/72  08/18/23 128/72      Physical Exam Vitals reviewed.  Constitutional:      General: She is not in acute distress.    Appearance: Normal appearance. She is normal weight. She is not ill-appearing.  HENT:     Head: Normocephalic.     Right Ear: Tympanic membrane normal.     Left Ear: Tympanic membrane normal.     Nose: Nose normal.     Mouth/Throat:     Mouth: Mucous membranes are moist.  Eyes:     Extraocular Movements: Extraocular movements intact.     Pupils: Pupils are equal, round, and reactive to light.  Cardiovascular:     Rate and Rhythm: Normal rate and regular rhythm.  Pulmonary:     Effort: Pulmonary effort is normal.     Breath sounds: Normal breath sounds.  Abdominal:     General:  Abdomen is flat. Bowel sounds are normal.     Palpations: Abdomen is soft.     Tenderness: There is no guarding or rebound.  Musculoskeletal:        General: Normal range of motion.     Cervical back: Normal range of motion.  Skin:    General: Skin is warm.     Capillary Refill: Capillary refill takes less than 2 seconds.  Neurological:     General: No focal deficit present.     Mental Status: She is alert.  Psychiatric:        Mood and Affect: Mood normal.        Behavior: Behavior normal.        Thought Content: Thought content normal.        Judgment: Judgment normal.       Results       Assessment & Plan:   Assessment and Plan Assessment & Plan Adult Wellness Visit Annual wellness visit conducted. No recent illnesses reported. Discussed recent activities including kayaking and biking. Reviewed health maintenance items including mammogram, bone density scan, colonoscopy, and Pap smear. Discussed exercise routine and dietary supplements. Discussed COVID-19 vaccination due to upcoming travel for daughter's childbirth. - Order cholesterol and other routine labs - Recommend COVID-19 vaccination with the new 2025-2026 vaccine - Ensure mammogram is up to date - Schedule bone density scan next year - Confirm colonoscopy due in 2027 - Verify eye and dental exams are up to date -Patient Counseling(The following topics were reviewed):  Preventative care handout given to pt  Health maintenance and immunizations reviewed. Please refer to Health maintenance section. Pt advised on safe sex, wearing seatbelts in car, and proper nutrition labwork ordered today for annual Dental health: Discussed importance of regular tooth brushing, flossing, and dental visits.   Osteoarthritis of hand and fingers Osteoarthritis in the hands and fingers with swelling, particularly in the left hand. No significant pain reported, but difficulty wearing wedding ring due to swelling. - Recommend  Voltaren gel for swelling - Suggest compression gloves for hands  Kyphosis Kyphosis with some curvature. Regular chiropractic visits mentioned. No significant back pain reported.  Allergic rhinitis Allergic rhinitis symptoms include nasal drip and fogging of glasses when wearing a mask. No recent exacerbations reported.  Goals of Care Discussed advanced directive and living will. She has an advanced directive but has not provided a copy to the office. - Remind to bring a copy of the advanced directive to the office          Follow-up:  Return in about 1 year (around 10/11/2024) for f/u CPE.   Ginger Patrick, FNP

## 2023-10-13 LAB — RHEUMATOID FACTOR: Rheumatoid fact SerPl-aCnc: <10 [IU]/mL (ref <14)

## 2023-10-15 LAB — ANA W/REFLEX: ANA Titer 1: NEGATIVE

## 2023-10-20 ENCOUNTER — Ambulatory Visit
Admission: RE | Admit: 2023-10-20 | Discharge: 2023-10-20 | Disposition: A | Discharge status: Home / Self Care | Source: Ambulatory Visit | Attending: Family | Admitting: Family

## 2023-10-20 DIAGNOSIS — Z1231 Encounter for screening mammogram for malignant neoplasm of breast: Secondary | ICD-10-CM

## 2023-10-30 MED ORDER — ATORVASTATIN CALCIUM 10 MG PO TABS: 10.0000 mg | ORAL_TABLET | Freq: Every day | ORAL | 3 refills | Status: AC

## 2023-11-07 ENCOUNTER — Ambulatory Visit: Admitting: Internal Medicine

## 2023-11-07 ENCOUNTER — Encounter: Payer: Self-pay | Admitting: Internal Medicine

## 2023-11-07 VITALS — BP 152/82 | HR 70 | Ht 62.0 in | Wt 136.0 lb

## 2023-11-07 DIAGNOSIS — M25511 Pain in right shoulder: Secondary | ICD-10-CM | POA: Diagnosis not present

## 2023-11-07 DIAGNOSIS — M542 Cervicalgia: Secondary | ICD-10-CM | POA: Diagnosis not present

## 2023-11-07 DIAGNOSIS — M5413 Radiculopathy, cervicothoracic region: Secondary | ICD-10-CM | POA: Diagnosis not present

## 2023-11-07 DIAGNOSIS — M9901 Segmental and somatic dysfunction of cervical region: Secondary | ICD-10-CM | POA: Diagnosis not present

## 2023-11-07 DIAGNOSIS — M810 Age-related osteoporosis without current pathological fracture: Secondary | ICD-10-CM | POA: Diagnosis not present

## 2023-11-07 DIAGNOSIS — M9902 Segmental and somatic dysfunction of thoracic region: Secondary | ICD-10-CM | POA: Diagnosis not present

## 2023-11-07 DIAGNOSIS — M9904 Segmental and somatic dysfunction of sacral region: Secondary | ICD-10-CM | POA: Diagnosis not present

## 2023-11-07 NOTE — Progress Notes (Signed)
 Name: Kimberly Cross  MRN/ DOB: [id number], [birth year]    Age/ Sex: 68 y.o., female    PCP: Corwin Antu, FNP   Reason for Endocrinology Evaluation: Osteoporosis     Date of Initial Endocrinology Evaluation: 06/01/2022    HPI: Kimberly Cross is a 68 y.o. female with a past medical history of PVC's, PVD, osteoporosis . The patient presented for initial endocrinology clinic visit on 06/01/2022 for consultative assistance with her osteoporosis.   Pt was diagnosed with osteoporosis: She was diagnosed in late 62s , based on our records DXA 2021 with AP spine -3.1   Menarche at age : 34 Menopausal at age : 38 Fracture Hx: no Hx of HRT: no FH of osteoporosis or hip fracture: mother  Prior Hx of anti-resorptive therapy : Fosamax for few months in her 38's but tore her esophagus.  We initially opted for Evenity but this was declined by her insurance and we opted to proceed with Prolia .  She received her first Prolia  injection on 08/10/2022  SUBJECTIVE:    Today (11/07/23):  Kimberly Cross is here for follow-up on osteoporosis.  Last Prolia  injection 08/17/2023  She continues with feeling achey after prolia  injection  No recent falls  No constipation or diarrhea  No heartburn    Alive Calcium 1 tabs contains  650 mg/50 total  MVI 2 gummies have    /200/ vitD 500 iu  Protein powder : contains 60 mg of calcium daily         HISTORY:  Past Medical History:  Past Medical History:  Diagnosis Date   Immunization, BCG    Osteoporosis    PVC (premature ventricular contraction)    UTI (urinary tract infection)    Past Surgical History:  Past Surgical History:  Procedure Laterality Date   APPENDECTOMY     BREAST BIOPSY Left    over 20 years ago- Benign   BREAST SURGERY Left    benign breast biopsy   THUMB ARTHROSCOPY Bilateral    VARICOSE VEIN SURGERY     WISDOM TOOTH EXTRACTION      Social History:  reports that she has never smoked. She has never used smokeless tobacco. She  reports that she does not currently use alcohol. She reports that she does not use drugs. Family History: family history includes Alzheimer's disease in her mother; Bladder Cancer in her brother; Diabetes in her brother, brother, and father; Heart attack (age of onset: 68) in her paternal uncle; Heart attack (age of onset: 3) in her brother; Heart attack (age of onset: 84) in her father; Heart attack (age of onset: 83) in her maternal grandfather; Heart disease in her father; Heart failure in her brother; Hyperlipidemia in her mother; Kidney failure in her maternal grandmother; Lung cancer in her paternal grandfather; Stroke (age of onset: 29) in her paternal grandmother.   HOME MEDICATIONS: Allergies as of 11/07/2023   No Known Allergies      Medication List        Accurate as of November 07, 2023 11:00 AM. If you have any questions, ask your nurse or doctor.          STOP taking these medications    Fish Oil 1000 MG Caps Stopped by: Donell PARAS Afsheen Antony       TAKE these medications    atorvastatin 10 MG tablet Commonly known as: LIPITOR Take 1 tablet (10 mg total) by mouth daily.   Calcium + D 315-5 MG-MCG Tabs Generic drug: Calcium  Citrate-Vitamin D  Take 1,300 mg by mouth daily. Taking 1/2 the dose.   denosumab  60 MG/ML Sosy injection Commonly known as: PROLIA  Inject 60 mg into the skin every 6 (six) months.   ferrous sulfate 324 MG Tbec Take 324 mg by mouth daily with breakfast.   ketoconazole  2 % shampoo Commonly known as: Nizoral  Apply 1 Application topically 2 (two) times a week. Apply to hair and lather leave in for 3 to 5 minutes then rinse out   MULTI-VITAMIN DAILY PO Take 2 tablets by mouth daily.   Turmeric 500 MG Caps Take 750 mg by mouth daily.          REVIEW OF SYSTEMS: A comprehensive ROS was conducted with the patient and is negative except as per HPI    OBJECTIVE:  VS: BP (!) 152/82   Pulse 70   Ht 5' 2 (1.575 m)   Wt 136 lb (61.7  kg)   SpO2 99%   BMI 24.87 kg/m    Wt Readings from Last 3 Encounters:  11/07/23 136 lb (61.7 kg)  10/12/23 134 lb 12.8 oz (61.1 kg)  09/18/23 140 lb (63.5 kg)     EXAM: General: Pt appears well and is in NAD  Neck: General: Supple without adenopathy. Thyroid : Thyroid  size normal.  No goiter or nodules appreciated.  Lungs: Clear with good BS bilat   Heart: Auscultation: RRR.  Abdomen: Soft, nontender  Extremities:  BL LE: No pretibial edema   Mental Status: Judgment, insight: Intact Orientation: Oriented to time, place, and person Mood and affect: No depression, anxiety, or agitation     DATA REVIEWED:    Latest Reference Range & Units 10/12/23 09:11  Sodium 135 - 145 mEq/L 140  Potassium 3.5 - 5.1 mEq/L 4.2  Chloride 96 - 112 mEq/L 103  CO2 19 - 32 mEq/L 30  Glucose 70 - 99 mg/dL 96  BUN 6 - 23 mg/dL 18  Creatinine 9.59 - 8.79 mg/dL 9.37  Calcium 8.4 - 89.4 mg/dL 9.3  GFR >39.99 mL/min 92.42     DXA 04/01/2022  ASSESSMENT: The BMD measured at AP Spine L1-L4 is 0.773 g/cm2 with a T-score of-3.4. This patient is considered osteoporotic according to World Health Organization [facility]) criteria.    Site Region Measured Date Measured Age YA BMD Significant CHANGE T-score   AP Spine L1-L4 04/01/2022         65.4         -3.4     0.773 g/cm2 * AP Spine  L1-L4      09/25/2019    62.9         -3.1    0.805 g/cm2   DualFemur Neck Right 04/01/2022    65.4         -3.1    0.608 g/cm2 DualFemur Neck Right 09/25/2019    62.9         -3.0    0.615 g/cm2   DualFemur Total Mean 04/01/2022    65.4         -2.3    0.720 g/cm2 DualFemur Total Mean 09/25/2019    62.9         -2.1    0.744 g/cm2    ASSESSMENT/PLAN/RECOMMENDATIONS:   Osteoporosis  -Patient has been diagnosed with osteoporosis in her late 56s, she has tried alendronate in the past but it caused severe esophageal side effects -Patient was started on Prolia  2024, she attributes occasional muscle aches and pains  to it, this has been  tolerable -She will be due for repeat DXA scan next year -We again emphasized the importance of optimizing calcium intake, I have encouraged the patient to incorporate weightbearing exercises - Recent labs done through PCPs office were reviewed, normal GFR and calcium  Medications : Calcium 1200 mg daily Vitamin D  daily   2. Hypercalciuria:   -She had an elevated 24-hour urine calcium 405 Mg in 06/2022 -She did reduce the amount of calcium by 50%   Follow-up in 1 year  Signed electronically by: Stefano Redgie Butts, MD  [facility] Endocrinology  [facility] [address] [place] [place], [place] [address] Phone: [phone] FAX: [phone]   CC: Corwin Antu, FNP [address] [place] [address] Phone: [phone] Fax: [phone]   Return to Endocrinology clinic as below: Future Appointments  Date Time Provider Department Center  02/23/2024  2:30 PM [facility] NURSE [facility] None

## 2023-11-07 NOTE — Patient Instructions (Signed)
 Please incorporate weightbearing exercises in addition to daily calcium intake

## 2023-11-09 ENCOUNTER — Ambulatory Visit: Admitting: Family

## 2023-11-09 ENCOUNTER — Ambulatory Visit: Payer: Self-pay | Admitting: Family

## 2023-11-09 ENCOUNTER — Encounter: Payer: Self-pay | Admitting: Family

## 2023-11-09 VITALS — BP 128/84 | HR 64 | Temp 98.3°F | Ht 62.0 in | Wt 137.0 lb

## 2023-11-09 DIAGNOSIS — Z20822 Contact with and (suspected) exposure to covid-19: Secondary | ICD-10-CM | POA: Diagnosis not present

## 2023-11-09 DIAGNOSIS — J0110 Acute frontal sinusitis, unspecified: Secondary | ICD-10-CM

## 2023-11-09 LAB — POC COVID19 BINAXNOW: SARS Coronavirus 2 Ag: NEGATIVE

## 2023-11-09 MED ORDER — DOXYCYCLINE HYCLATE 100 MG PO TABS: 100.0000 mg | ORAL_TABLET | Freq: Two times a day (BID) | ORAL | 0 refills | Status: AC

## 2023-11-09 NOTE — Progress Notes (Signed)
 2  Established Patient Office Visit  Subjective:      CC:  Chief Complaint  Patient presents with   Acute Visit    Reports sinus congestion. Denies cough, headache, sore throat, fever, body aches or chills.    HPI: Kimberly Cross is a 68 y.o. female presenting on 11/09/2023 for Acute Visit (Reports sinus congestion. Denies cough, headache, sore throat, fever, body aches or chills.) .  Discussed the use of AI scribe software for clinical note transcription with the patient, who gave verbal consent to proceed.  History of Present Illness Kimberly Cross is a 68 year old female who presents with persistent nasal drainage and sinus pressure.  She has been experiencing intermittent runny nose symptoms for months, which have worsened over the past few weeks, becoming debilitating. The nasal discharge is clear, described as 'someone turns the faucet on,' and she frequently uses a box of tissues due to the drainage.  She experiences sinus pressure in her face, particularly around the eyes, and describes a 'cotton wool' feeling in her ears. No fever is present, but the nasal drainage sometimes causes a sore throat and cough when it drains down the back of her throat.  She has been using a nasal spray prescribed by her ear, nose, and throat doctor, but finds it difficult to use when her nose is excessively runny. She has also been taking Benadryl for allergies.  She feels run down and occasionally misses work due to the severity of her symptoms. She has tried Tylenol for tenderness and is open to using ibuprofen if it might be more effective.  She recently received a COVID-19 vaccination and experienced feeling unwell the following day. She is planning to travel soon to assist with her premature granddaughter and is concerned about managing her symptoms during the trip.         Social history:  Relevant past medical, surgical, family and social history reviewed and updated as indicated. Interim  medical history since our last visit reviewed.  Allergies and medications reviewed and updated.  DATA REVIEWED: CHART IN EPIC     ROS: Negative unless specifically indicated above in HPI.    Current Outpatient Medications:    doxycycline (VIBRA-TABS) 100 MG tablet, Take 1 tablet (100 mg total) by mouth 2 (two) times daily for 10 days., Disp: 20 tablet, Rfl: 0   ipratropium (ATROVENT) 0.03 % nasal spray, Place 2 sprays into both nostrils every 12 (twelve) hours., Disp: , Rfl:    atorvastatin (LIPITOR) 10 MG tablet, Take 1 tablet (10 mg total) by mouth daily., Disp: 90 tablet, Rfl: 3   Calcium Citrate-Vitamin D  (CALCIUM + D) 315-5 MG-MCG TABS, Take 1,300 mg by mouth daily. Taking 1/2 the dose., Disp: , Rfl:    denosumab  (PROLIA ) 60 MG/ML SOSY injection, Inject 60 mg into the skin every 6 (six) months., Disp: , Rfl:    ferrous sulfate 324 MG TBEC, Take 324 mg by mouth daily with breakfast., Disp: , Rfl:    ketoconazole  (NIZORAL ) 2 % shampoo, Apply 1 Application topically 2 (two) times a week. Apply to hair and lather leave in for 3 to 5 minutes then rinse out, Disp: 120 mL, Rfl: 0   Multiple Vitamin (MULTI-VITAMIN DAILY PO), Take 2 tablets by mouth daily., Disp: , Rfl:    Turmeric 500 MG CAPS, Take 750 mg by mouth daily., Disp: , Rfl:   Current Facility-Administered Medications:    denosumab  (PROLIA ) injection 60 mg, 60 mg, Subcutaneous, Once, Shamleffer, Ibtehal Jaralla,  MD   [START ON 02/18/2024] denosumab  (PROLIA ) injection 60 mg, 60 mg, Subcutaneous, Once, Shamleffer, Ibtehal Jaralla, MD        Objective:        BP 128/84 (BP Location: Left Arm, Patient Position: Sitting, Cuff Size: Normal)   Pulse 64   Temp 98.3 F (36.8 C) (Temporal)   Ht 5' 2 (1.575 m)   Wt 137 lb (62.1 kg)   SpO2 99%   BMI 25.06 kg/m   Physical Exam HEENT: Pharynx with drainage. Nasal mucosa swollen on right side. Maxillary sinuses tender and swollen.  Wt Readings from Last 3 Encounters:   11/09/23 137 lb (62.1 kg)  11/07/23 136 lb (61.7 kg)  10/12/23 134 lb 12.8 oz (61.1 kg)    Physical Exam Vitals reviewed.  Constitutional:      General: She is not in acute distress.    Appearance: Normal appearance. She is normal weight. She is not ill-appearing, toxic-appearing or diaphoretic.  HENT:     Head: Normocephalic.     Right Ear: Tympanic membrane normal.     Left Ear: Tympanic membrane normal.     Nose: Nose normal.     Mouth/Throat:     Mouth: Mucous membranes are dry.     Pharynx: No oropharyngeal exudate or posterior oropharyngeal erythema.  Eyes:     Extraocular Movements: Extraocular movements intact.     Pupils: Pupils are equal, round, and reactive to light.  Cardiovascular:     Rate and Rhythm: Normal rate and regular rhythm.     Pulses: Normal pulses.     Heart sounds: Normal heart sounds.  Pulmonary:     Effort: Pulmonary effort is normal.     Breath sounds: Normal breath sounds.  Musculoskeletal:     Cervical back: Normal range of motion.  Neurological:     General: No focal deficit present.     Mental Status: She is alert and oriented to person, place, and time. Mental status is at baseline.  Psychiatric:        Mood and Affect: Mood normal.        Behavior: Behavior normal.        Thought Content: Thought content normal.        Judgment: Judgment normal.          Results   Assessment & Plan:   Assessment and Plan Assessment & Plan Acute frontal sinusitis Symptoms persisting for over seven days, including sinus pressure, clear nasal drainage, and tenderness around the eyes. No fever reported. Symptoms are debilitating and affecting daily activities. Differential includes sinus infection, especially with upcoming travel. Previous treatments with Augmentin and Z-Pak were less effective. Doxycycline is considered a better option based on past experiences. - Prescribed doxycycline for sinus infection. - Recommended ibuprofen for  inflammation and tenderness. - Advised warm compresses on tender spots. - Switch from Benadryl to Zyrtec for allergy management, with caution for drowsiness. - If Zyrtec causes drowsiness, switch to Claritin or Allegra.        Return if symptoms worsen or fail to improve.     Ginger Patrick, MSN, APRN, FNP-C [facility]

## 2023-12-06 ENCOUNTER — Ambulatory Visit: Payer: Medicare HMO | Admitting: Internal Medicine

## 2023-12-11 ENCOUNTER — Encounter: Payer: Self-pay | Admitting: Family

## 2023-12-12 DIAGNOSIS — M9902 Segmental and somatic dysfunction of thoracic region: Secondary | ICD-10-CM | POA: Diagnosis not present

## 2023-12-12 DIAGNOSIS — M5413 Radiculopathy, cervicothoracic region: Secondary | ICD-10-CM | POA: Diagnosis not present

## 2023-12-12 DIAGNOSIS — M9904 Segmental and somatic dysfunction of sacral region: Secondary | ICD-10-CM | POA: Diagnosis not present

## 2023-12-12 DIAGNOSIS — M9901 Segmental and somatic dysfunction of cervical region: Secondary | ICD-10-CM | POA: Diagnosis not present

## 2023-12-12 DIAGNOSIS — M25511 Pain in right shoulder: Secondary | ICD-10-CM | POA: Diagnosis not present

## 2023-12-12 DIAGNOSIS — M542 Cervicalgia: Secondary | ICD-10-CM | POA: Diagnosis not present

## 2023-12-21 DIAGNOSIS — H26491 Other secondary cataract, right eye: Secondary | ICD-10-CM | POA: Diagnosis not present

## 2024-01-30 NOTE — Telephone Encounter (Signed)
 Prolia  VOB initiated via [email or web address]  Next Prolia  inj DUE: 02/19/24

## 2024-02-05 ENCOUNTER — Ambulatory Visit: Admitting: Adult Health

## 2024-02-06 NOTE — Telephone Encounter (Signed)
 [place] policy [id number] has termed, update insurance.

## 2024-02-06 NOTE — Telephone Encounter (Signed)
 [employer] - [employer] -  Subscriber: [name] Subscriber PI:[id number] Relationship:Self Member:Kimberly Cross Member PI:[id number] ONA:[id number] Plan year: 01/04/2024 - [place] Effective dates: 01/04/2024 - [name]

## 2024-02-09 NOTE — Telephone Encounter (Signed)
 Insurance updated to [employer]. VOB re-submitted.

## 2024-02-16 ENCOUNTER — Ambulatory Visit: Admitting: Adult Health

## 2024-02-23 ENCOUNTER — Ambulatory Visit

## 2024-11-05 ENCOUNTER — Ambulatory Visit: Admitting: Internal Medicine
